# Patient Record
Sex: Female | Born: 1945 | Race: White | Hispanic: No | Marital: Married | Smoking: Former smoker
Health system: Southern US, Community
[De-identification: ages and names within clinical notes are randomized; demographics above are authoritative.]

## PROBLEM LIST (undated history)

## (undated) DIAGNOSIS — M1990 Unspecified osteoarthritis, unspecified site: Secondary | ICD-10-CM

## (undated) DIAGNOSIS — K219 Gastro-esophageal reflux disease without esophagitis: Secondary | ICD-10-CM

## (undated) DIAGNOSIS — Z87898 Personal history of other specified conditions: Secondary | ICD-10-CM

## (undated) DIAGNOSIS — J189 Pneumonia, unspecified organism: Secondary | ICD-10-CM

## (undated) DIAGNOSIS — E041 Nontoxic single thyroid nodule: Secondary | ICD-10-CM

## (undated) DIAGNOSIS — C801 Malignant (primary) neoplasm, unspecified: Secondary | ICD-10-CM

## (undated) DIAGNOSIS — N390 Urinary tract infection, site not specified: Secondary | ICD-10-CM

## (undated) HISTORY — PX: ABDOMINAL HYSTERECTOMY: SHX81

---

## 2000-03-18 HISTORY — PX: NASAL SINUS SURGERY: SHX719

## 2000-03-18 HISTORY — PX: COLON RESECTION: SHX5231

## 2000-04-11 ENCOUNTER — Other Ambulatory Visit: Admission: RE | Admit: 2000-04-11 | Discharge: 2000-04-11 | Payer: Self-pay | Admitting: Otolaryngology

## 2000-10-16 ENCOUNTER — Other Ambulatory Visit: Admission: RE | Admit: 2000-10-16 | Discharge: 2000-10-16 | Payer: Self-pay | Admitting: Gynecology

## 2000-10-20 ENCOUNTER — Ambulatory Visit (HOSPITAL_COMMUNITY): Admission: RE | Admit: 2000-10-20 | Discharge: 2000-10-20 | Payer: Self-pay | Admitting: Gastroenterology

## 2000-11-03 ENCOUNTER — Other Ambulatory Visit: Admission: RE | Admit: 2000-11-03 | Discharge: 2000-11-03 | Payer: Self-pay | Admitting: Urology

## 2001-01-01 ENCOUNTER — Encounter: Admission: RE | Admit: 2001-01-01 | Discharge: 2001-01-13 | Payer: Self-pay | Admitting: Surgery

## 2001-01-01 ENCOUNTER — Encounter: Payer: Self-pay | Admitting: Surgery

## 2001-01-08 ENCOUNTER — Inpatient Hospital Stay (HOSPITAL_COMMUNITY): Admission: RE | Admit: 2001-01-08 | Discharge: 2001-01-13 | Payer: Self-pay | Admitting: Surgery

## 2001-12-10 ENCOUNTER — Other Ambulatory Visit: Admission: RE | Admit: 2001-12-10 | Discharge: 2001-12-10 | Payer: Self-pay | Admitting: Gynecology

## 2002-12-13 ENCOUNTER — Other Ambulatory Visit: Admission: RE | Admit: 2002-12-13 | Discharge: 2002-12-13 | Payer: Self-pay | Admitting: Gynecology

## 2003-12-15 ENCOUNTER — Other Ambulatory Visit: Admission: RE | Admit: 2003-12-15 | Discharge: 2003-12-15 | Payer: Self-pay | Admitting: Gynecology

## 2004-12-31 ENCOUNTER — Other Ambulatory Visit: Admission: RE | Admit: 2004-12-31 | Discharge: 2004-12-31 | Payer: Self-pay | Admitting: Gynecology

## 2006-03-04 ENCOUNTER — Other Ambulatory Visit: Admission: RE | Admit: 2006-03-04 | Discharge: 2006-03-04 | Payer: Self-pay | Admitting: Gynecology

## 2010-10-01 ENCOUNTER — Other Ambulatory Visit: Payer: Self-pay | Admitting: Gynecology

## 2010-11-02 ENCOUNTER — Other Ambulatory Visit: Payer: Self-pay | Admitting: Gastroenterology

## 2010-11-06 ENCOUNTER — Ambulatory Visit
Admission: RE | Admit: 2010-11-06 | Discharge: 2010-11-06 | Disposition: A | Discharge status: Home / Self Care | Payer: Medicare Other | Source: Ambulatory Visit | Attending: Gastroenterology | Admitting: Gastroenterology

## 2010-11-06 MED ORDER — IOHEXOL 300 MG/ML  SOLN
100.0000 mL | Freq: Once | INTRAMUSCULAR | Status: AC | PRN
  Administered 2010-11-06: 100 mL via INTRAVENOUS

## 2011-03-19 DIAGNOSIS — J189 Pneumonia, unspecified organism: Secondary | ICD-10-CM

## 2011-03-19 HISTORY — DX: Pneumonia, unspecified organism: J18.9

## 2011-12-10 ENCOUNTER — Other Ambulatory Visit: Payer: Self-pay | Admitting: Gynecology

## 2012-01-01 ENCOUNTER — Other Ambulatory Visit: Payer: Self-pay | Admitting: Internal Medicine

## 2012-01-01 DIAGNOSIS — E041 Nontoxic single thyroid nodule: Secondary | ICD-10-CM

## 2012-06-25 ENCOUNTER — Other Ambulatory Visit: Payer: Self-pay | Admitting: Orthopaedic Surgery

## 2012-06-25 DIAGNOSIS — M542 Cervicalgia: Secondary | ICD-10-CM

## 2012-06-26 ENCOUNTER — Ambulatory Visit
Admission: RE | Admit: 2012-06-26 | Discharge: 2012-06-26 | Disposition: A | Discharge status: Home / Self Care | Payer: Medicare Other | Source: Ambulatory Visit | Attending: Orthopaedic Surgery | Admitting: Orthopaedic Surgery

## 2012-06-26 DIAGNOSIS — M542 Cervicalgia: Secondary | ICD-10-CM

## 2013-07-05 ENCOUNTER — Other Ambulatory Visit: Payer: Self-pay | Admitting: Internal Medicine

## 2013-07-05 DIAGNOSIS — E041 Nontoxic single thyroid nodule: Secondary | ICD-10-CM

## 2013-07-08 ENCOUNTER — Encounter (INDEPENDENT_AMBULATORY_CARE_PROVIDER_SITE_OTHER): Payer: Self-pay

## 2013-07-08 ENCOUNTER — Ambulatory Visit
Admission: RE | Admit: 2013-07-08 | Discharge: 2013-07-08 | Disposition: A | Discharge status: Home / Self Care | Payer: Medicare Other | Source: Ambulatory Visit | Attending: Internal Medicine | Admitting: Internal Medicine

## 2013-07-08 DIAGNOSIS — E041 Nontoxic single thyroid nodule: Secondary | ICD-10-CM

## 2014-01-24 ENCOUNTER — Other Ambulatory Visit: Payer: Self-pay | Admitting: Internal Medicine

## 2014-01-24 DIAGNOSIS — R140 Abdominal distension (gaseous): Secondary | ICD-10-CM

## 2014-02-01 ENCOUNTER — Ambulatory Visit
Admission: RE | Admit: 2014-02-01 | Discharge: 2014-02-01 | Disposition: A | Discharge status: Home / Self Care | Payer: Medicare Other | Source: Ambulatory Visit | Attending: Internal Medicine | Admitting: Internal Medicine

## 2014-02-01 ENCOUNTER — Encounter (INDEPENDENT_AMBULATORY_CARE_PROVIDER_SITE_OTHER): Payer: Self-pay

## 2014-02-01 ENCOUNTER — Other Ambulatory Visit: Payer: Self-pay | Admitting: Internal Medicine

## 2014-02-01 DIAGNOSIS — R140 Abdominal distension (gaseous): Secondary | ICD-10-CM

## 2014-03-07 ENCOUNTER — Ambulatory Visit (INDEPENDENT_AMBULATORY_CARE_PROVIDER_SITE_OTHER): Payer: Self-pay | Admitting: Surgery

## 2014-05-02 NOTE — Patient Instructions (Addendum)
Sabrina Levy  05/02/2014   Your procedure is scheduled on: 05/06/2014    Report to [facility] Main  Entrance and follow signs to               [place] at      [place].  Call this number if you have problems the morning of surgery [phone]   Remember:  Do not eat food or drink liquids :After Midnight.     Take these medicines the morning of surgery with A SIP OF WATER: none                                You may not have any metal on your body including hair pins and              piercings  Do not wear jewelry, make-up, lotions, powders or perfumes., deodorant.               Do not wear nail polish.  Do not shave  48 hours prior to surgery.     Do not bring valuables to the hospital. [place].  Contacts, dentures or bridgework may not be worn into surgery.      Patients discharged the day of surgery will not be allowed to drive home.  Name and phone number of your driver:  Special Instructions: coughing and deep breathing exercises, leg exercises              Please read over the following fact sheets you were given: _____________________________________________________________________             [facility] - Preparing for Surgery Before surgery, you can play an important role.  Because skin is not sterile, your skin needs to be as free of germs as possible.  You can reduce the number of germs on your skin by washing with CHG (chlorahexidine gluconate) soap before surgery.  CHG is an antiseptic cleaner which kills germs and bonds with the skin to continue killing germs even after washing. Please DO NOT use if you have an allergy to CHG or antibacterial soaps.  If your skin becomes reddened/irritated stop using the CHG and inform your nurse when you arrive at Short Stay. Do not shave (including legs and underarms) for at least 48 hours prior to the first CHG shower.  You may shave your  face/neck. Please follow these instructions carefully:  1.  Shower with CHG Soap the night before surgery and the  morning of Surgery.  2.  If you choose to wash your hair, wash your hair first as usual with your  normal  shampoo.  3.  After you shampoo, rinse your hair and body thoroughly to remove the  shampoo.                           4.  Use CHG as you would any other liquid soap.  You can apply chg directly  to the skin and wash                       Gently with a scrungie or clean washcloth.  5.  Apply the CHG Soap to your  body ONLY FROM THE NECK DOWN.   Do not use on face/ open                           Wound or open sores. Avoid contact with eyes, ears mouth and genitals (private parts).                       Wash face,  Genitals (private parts) with your normal soap.             6.  Wash thoroughly, paying special attention to the area where your surgery  will be performed.  7.  Thoroughly rinse your body with warm water from the neck down.  8.  DO NOT shower/wash with your normal soap after using and rinsing off  the CHG Soap.                9.  Pat yourself dry with a clean towel.            10.  Wear clean pajamas.            11.  Place clean sheets on your bed the night of your first shower and do not  sleep with pets. Day of Surgery : Do not apply any lotions/deodorants the morning of surgery.  Please wear clean clothes to the hospital/surgery center.  FAILURE TO FOLLOW THESE INSTRUCTIONS MAY RESULT IN THE CANCELLATION OF YOUR SURGERY PATIENT SIGNATURE_________________________________  NURSE SIGNATURE__________________________________  ________________________________________________________________________

## 2014-05-03 ENCOUNTER — Encounter (HOSPITAL_COMMUNITY)
Admission: RE | Admit: 2014-05-03 | Discharge: 2014-05-03 | Disposition: A | Discharge status: Home / Self Care | Payer: Medicare Other | Source: Ambulatory Visit | Attending: Surgery | Admitting: Surgery

## 2014-05-03 ENCOUNTER — Encounter (HOSPITAL_COMMUNITY): Payer: Self-pay

## 2014-05-03 ENCOUNTER — Inpatient Hospital Stay (HOSPITAL_COMMUNITY): Admission: RE | Admit: 2014-05-03 | Payer: Medicare Other | Source: Ambulatory Visit

## 2014-05-03 DIAGNOSIS — R109 Unspecified abdominal pain: Secondary | ICD-10-CM | POA: Diagnosis present

## 2014-05-03 DIAGNOSIS — Z9049 Acquired absence of other specified parts of digestive tract: Secondary | ICD-10-CM | POA: Diagnosis not present

## 2014-05-03 DIAGNOSIS — Z90710 Acquired absence of both cervix and uterus: Secondary | ICD-10-CM | POA: Diagnosis not present

## 2014-05-03 DIAGNOSIS — Z886 Allergy status to analgesic agent status: Secondary | ICD-10-CM | POA: Diagnosis not present

## 2014-05-03 DIAGNOSIS — Z87891 Personal history of nicotine dependence: Secondary | ICD-10-CM | POA: Diagnosis not present

## 2014-05-03 DIAGNOSIS — K811 Chronic cholecystitis: Secondary | ICD-10-CM | POA: Diagnosis not present

## 2014-05-03 HISTORY — DX: Urinary tract infection, site not specified: N39.0

## 2014-05-03 HISTORY — DX: Nontoxic single thyroid nodule: E04.1

## 2014-05-03 HISTORY — DX: Malignant (primary) neoplasm, unspecified: C80.1

## 2014-05-03 HISTORY — DX: Unspecified osteoarthritis, unspecified site: M19.90

## 2014-05-03 HISTORY — DX: Gastro-esophageal reflux disease without esophagitis: K21.9

## 2014-05-03 LAB — CBC
HEMATOCRIT: 40.4 % (ref 36.0–46.0)
Hemoglobin: 13 g/dL (ref 12.0–15.0)
MCH: 29.7 pg (ref 26.0–34.0)
MCHC: 32.2 g/dL (ref 30.0–36.0)
MCV: 92.4 fL (ref 78.0–100.0)
Platelets: 417 10*3/uL — ABNORMAL HIGH (ref 150–400)
RBC: 4.37 MIL/uL (ref 3.87–5.11)
RDW: 14.5 % (ref 11.5–15.5)
WBC: 10.5 10*3/uL (ref 4.0–10.5)

## 2014-05-04 NOTE — Progress Notes (Signed)
Quick Note:  These results are acceptable for scheduled surgery.  Frazier Balfour M. Rudell Ortman, MD, FACS [facility] Office: [phone]   ______ 

## 2014-05-06 ENCOUNTER — Observation Stay (HOSPITAL_COMMUNITY)
Admission: RE | Admit: 2014-05-06 | Discharge: 2014-05-07 | Disposition: A | Discharge status: Home / Self Care | Payer: Medicare Other | Source: Ambulatory Visit | Attending: Surgery | Admitting: Surgery

## 2014-05-06 ENCOUNTER — Ambulatory Visit (HOSPITAL_COMMUNITY): Payer: Medicare Other

## 2014-05-06 ENCOUNTER — Ambulatory Visit (HOSPITAL_COMMUNITY): Payer: Medicare Other | Admitting: Anesthesiology

## 2014-05-06 ENCOUNTER — Encounter (HOSPITAL_COMMUNITY)
Admission: RE | Disposition: A | Discharge status: Home / Self Care | Payer: Self-pay | Source: Ambulatory Visit | Attending: Surgery

## 2014-05-06 ENCOUNTER — Encounter (HOSPITAL_COMMUNITY): Payer: Self-pay

## 2014-05-06 DIAGNOSIS — K811 Chronic cholecystitis: Secondary | ICD-10-CM | POA: Diagnosis not present

## 2014-05-06 DIAGNOSIS — Z90710 Acquired absence of both cervix and uterus: Secondary | ICD-10-CM | POA: Insufficient documentation

## 2014-05-06 DIAGNOSIS — Z886 Allergy status to analgesic agent status: Secondary | ICD-10-CM | POA: Diagnosis not present

## 2014-05-06 DIAGNOSIS — Z87891 Personal history of nicotine dependence: Secondary | ICD-10-CM | POA: Insufficient documentation

## 2014-05-06 DIAGNOSIS — K828 Other specified diseases of gallbladder: Secondary | ICD-10-CM

## 2014-05-06 DIAGNOSIS — Z9049 Acquired absence of other specified parts of digestive tract: Secondary | ICD-10-CM | POA: Diagnosis not present

## 2014-05-06 DIAGNOSIS — Z419 Encounter for procedure for purposes other than remedying health state, unspecified: Secondary | ICD-10-CM

## 2014-05-06 HISTORY — DX: Personal history of other specified conditions: Z87.898

## 2014-05-06 HISTORY — PX: CHOLECYSTECTOMY: SHX55

## 2014-05-06 HISTORY — DX: Pneumonia, unspecified organism: J18.9

## 2014-05-06 SURGERY — LAPAROSCOPIC CHOLECYSTECTOMY WITH INTRAOPERATIVE CHOLANGIOGRAM
Anesthesia: General | Site: Abdomen

## 2014-05-06 MED ORDER — FENTANYL CITRATE 0.05 MG/ML IJ SOLN
INTRAMUSCULAR | Status: AC
  Filled 2014-05-06: qty 2

## 2014-05-06 MED ORDER — ROCURONIUM BROMIDE 100 MG/10ML IV SOLN
INTRAVENOUS | Status: AC
  Filled 2014-05-06: qty 1

## 2014-05-06 MED ORDER — IOHEXOL 300 MG/ML  SOLN
INTRAMUSCULAR | Status: DC | PRN
  Administered 2014-05-06: 13 mL

## 2014-05-06 MED ORDER — FENTANYL CITRATE 0.05 MG/ML IJ SOLN
25.0000 ug | INTRAMUSCULAR | Status: DC | PRN
Start: 2014-05-06 — End: 2014-05-06
  Administered 2014-05-06 (×3): 50 ug via INTRAVENOUS

## 2014-05-06 MED ORDER — LIDOCAINE HCL (CARDIAC) 20 MG/ML IV SOLN
INTRAVENOUS | Status: AC
  Filled 2014-05-06: qty 5

## 2014-05-06 MED ORDER — LIDOCAINE HCL (CARDIAC) 20 MG/ML IV SOLN
INTRAVENOUS | Status: DC | PRN
  Administered 2014-05-06: 60 mg via INTRAVENOUS

## 2014-05-06 MED ORDER — METOCLOPRAMIDE HCL 5 MG/ML IJ SOLN
INTRAMUSCULAR | Status: DC | PRN
Start: 2014-05-06 — End: 2014-05-06
  Administered 2014-05-06: 10 mg via INTRAVENOUS

## 2014-05-06 MED ORDER — ONDANSETRON HCL 4 MG/2ML IJ SOLN
INTRAMUSCULAR | Status: AC
  Filled 2014-05-06: qty 2

## 2014-05-06 MED ORDER — FENTANYL CITRATE 0.05 MG/ML IJ SOLN
INTRAMUSCULAR | Status: DC | PRN
  Administered 2014-05-06 (×5): 50 ug via INTRAVENOUS

## 2014-05-06 MED ORDER — GLYCOPYRROLATE 0.2 MG/ML IJ SOLN
INTRAMUSCULAR | Status: AC
  Filled 2014-05-06: qty 1

## 2014-05-06 MED ORDER — GLYCOPYRROLATE 0.2 MG/ML IJ SOLN
INTRAMUSCULAR | Status: DC | PRN
  Administered 2014-05-06: 0.4 mg via INTRAVENOUS

## 2014-05-06 MED ORDER — PROPOFOL 10 MG/ML IV BOLUS
INTRAVENOUS | Status: DC | PRN
  Administered 2014-05-06: 100 mg via INTRAVENOUS

## 2014-05-06 MED ORDER — ACETAMINOPHEN 325 MG PO TABS: 650.0000 mg | ORAL_TABLET | ORAL | Status: DC | PRN

## 2014-05-06 MED ORDER — NEOSTIGMINE METHYLSULFATE 10 MG/10ML IV SOLN
INTRAVENOUS | Status: AC
  Filled 2014-05-06: qty 1

## 2014-05-06 MED ORDER — ONDANSETRON HCL 4 MG/2ML IJ SOLN
INTRAMUSCULAR | Status: DC | PRN
  Administered 2014-05-06: 4 mg via INTRAVENOUS

## 2014-05-06 MED ORDER — NEOSTIGMINE METHYLSULFATE 10 MG/10ML IV SOLN
INTRAVENOUS | Status: DC | PRN
  Administered 2014-05-06: 3 mg via INTRAVENOUS

## 2014-05-06 MED ORDER — KCL IN DEXTROSE-NACL 20-5-0.45 MEQ/L-%-% IV SOLN
INTRAVENOUS | Status: DC
  Administered 2014-05-06: 11:00:00 via INTRAVENOUS
  Filled 2014-05-06 (×2): qty 1000

## 2014-05-06 MED ORDER — ONDANSETRON HCL 4 MG/2ML IJ SOLN: 4.0000 mg | Freq: Four times a day (QID) | INTRAMUSCULAR | Status: DC | PRN

## 2014-05-06 MED ORDER — DEXAMETHASONE SODIUM PHOSPHATE 10 MG/ML IJ SOLN
INTRAMUSCULAR | Status: DC | PRN
  Administered 2014-05-06: 10 mg via INTRAVENOUS

## 2014-05-06 MED ORDER — CEFAZOLIN SODIUM-DEXTROSE 2-3 GM-% IV SOLR
INTRAVENOUS | Status: AC
  Filled 2014-05-06: qty 50

## 2014-05-06 MED ORDER — ONDANSETRON HCL 4 MG PO TABS: 4.0000 mg | ORAL_TABLET | Freq: Four times a day (QID) | ORAL | Status: DC | PRN

## 2014-05-06 MED ORDER — BUPIVACAINE-EPINEPHRINE 0.5% -1:200000 IJ SOLN
INTRAMUSCULAR | Status: DC | PRN
  Administered 2014-05-06: 20 mL

## 2014-05-06 MED ORDER — HYDROCODONE-ACETAMINOPHEN 5-325 MG PO TABS
1.0000 | ORAL_TABLET | ORAL | Status: DC | PRN
  Administered 2014-05-06 (×3): 1 via ORAL
  Administered 2014-05-07: 2 via ORAL
  Administered 2014-05-07: 1 via ORAL
  Filled 2014-05-06 (×4): qty 1
  Filled 2014-05-06: qty 2

## 2014-05-06 MED ORDER — ROCURONIUM BROMIDE 100 MG/10ML IV SOLN
INTRAVENOUS | Status: DC | PRN
  Administered 2014-05-06: 40 mg via INTRAVENOUS

## 2014-05-06 MED ORDER — LACTATED RINGERS IV SOLN
INTRAVENOUS | Status: DC
  Administered 2014-05-06: 09:00:00 via INTRAVENOUS

## 2014-05-06 MED ORDER — LACTATED RINGERS IV SOLN
INTRAVENOUS | Status: DC | PRN
  Administered 2014-05-06: 07:00:00 via INTRAVENOUS

## 2014-05-06 MED ORDER — LACTATED RINGERS IR SOLN
Status: DC | PRN
  Administered 2014-05-06: 1000 mL

## 2014-05-06 MED ORDER — MIDAZOLAM HCL 2 MG/2ML IJ SOLN
INTRAMUSCULAR | Status: AC
  Filled 2014-05-06: qty 2

## 2014-05-06 MED ORDER — ONDANSETRON HCL 4 MG/2ML IJ SOLN
4.0000 mg | Freq: Once | INTRAMUSCULAR | Status: AC | PRN
  Administered 2014-05-06: 4 mg via INTRAVENOUS

## 2014-05-06 MED ORDER — PROPOFOL 10 MG/ML IV BOLUS
INTRAVENOUS | Status: AC
  Filled 2014-05-06: qty 20

## 2014-05-06 MED ORDER — MIDAZOLAM HCL 5 MG/5ML IJ SOLN
INTRAMUSCULAR | Status: DC | PRN
  Administered 2014-05-06: 1 mg via INTRAVENOUS

## 2014-05-06 MED ORDER — HYDROMORPHONE HCL 1 MG/ML IJ SOLN: 1.0000 mg | INTRAMUSCULAR | Status: DC | PRN

## 2014-05-06 MED ORDER — CEFAZOLIN SODIUM-DEXTROSE 2-3 GM-% IV SOLR
2.0000 g | INTRAVENOUS | Status: AC
  Administered 2014-05-06: 2 g via INTRAVENOUS

## 2014-05-06 MED ORDER — BUPIVACAINE-EPINEPHRINE (PF) 0.5% -1:200000 IJ SOLN
INTRAMUSCULAR | Status: AC
  Filled 2014-05-06: qty 30

## 2014-05-06 MED ORDER — FENTANYL CITRATE 0.05 MG/ML IJ SOLN
INTRAMUSCULAR | Status: AC
  Filled 2014-05-06: qty 5

## 2014-05-06 MED ORDER — MEPERIDINE HCL 50 MG/ML IJ SOLN: 6.2500 mg | INTRAMUSCULAR | Status: DC | PRN

## 2014-05-06 SURGICAL SUPPLY — 33 items
APPLIER CLIP ROT 10 11.4 M/L (STAPLE) ×2
BENZOIN TINCTURE PRP APPL 2/3 (GAUZE/BANDAGES/DRESSINGS) ×2 IMPLANT
CABLE HIGH FREQUENCY MONO STRZ (ELECTRODE) ×2 IMPLANT
CHLORAPREP W/TINT 26ML (MISCELLANEOUS) ×2 IMPLANT
CLIP APPLIE ROT 10 11.4 M/L (STAPLE) ×1 IMPLANT
COVER MAYO STAND STRL (DRAPES) ×2 IMPLANT
DECANTER SPIKE VIAL GLASS SM (MISCELLANEOUS) ×2 IMPLANT
DRAPE C-ARM 42X120 X-RAY (DRAPES) ×2 IMPLANT
DRAPE LAPAROSCOPIC ABDOMINAL (DRAPES) ×2 IMPLANT
DRAPE UTILITY XL STRL (DRAPES) ×2 IMPLANT
ELECT REM PT RETURN 9FT ADLT (ELECTROSURGICAL) ×2
ELECTRODE REM PT RTRN 9FT ADLT (ELECTROSURGICAL) ×1 IMPLANT
GAUZE SPONGE 2X2 8PLY STRL LF (GAUZE/BANDAGES/DRESSINGS) ×1 IMPLANT
GLOVE SURG ORTHO 8.0 STRL STRW (GLOVE) ×2 IMPLANT
GOWN STRL REUS W/TWL XL LVL3 (GOWN DISPOSABLE) ×4 IMPLANT
HEMOSTAT SURGICEL 4X8 (HEMOSTASIS) IMPLANT
KIT BASIN OR (CUSTOM PROCEDURE TRAY) ×2 IMPLANT
LIQUID BAND (GAUZE/BANDAGES/DRESSINGS) ×2 IMPLANT
NS IRRIG 1000ML POUR BTL (IV SOLUTION) ×2 IMPLANT
POUCH SPECIMEN RETRIEVAL 10MM (ENDOMECHANICALS) ×2 IMPLANT
SCISSORS LAP 5X35 DISP (ENDOMECHANICALS) ×2 IMPLANT
SET CHOLANGIOGRAPH MIX (MISCELLANEOUS) ×2 IMPLANT
SET IRRIG TUBING LAPAROSCOPIC (IRRIGATION / IRRIGATOR) ×2 IMPLANT
SLEEVE XCEL OPT CAN 5 100 (ENDOMECHANICALS) ×2 IMPLANT
SPONGE GAUZE 2X2 STER 10/PKG (GAUZE/BANDAGES/DRESSINGS) ×1
STRIP CLOSURE SKIN 1/2X4 (GAUZE/BANDAGES/DRESSINGS) ×2 IMPLANT
SUT MNCRL AB 4-0 PS2 18 (SUTURE) ×2 IMPLANT
TOWEL OR 17X26 10 PK STRL BLUE (TOWEL DISPOSABLE) ×2 IMPLANT
TOWEL OR NON WOVEN STRL DISP B (DISPOSABLE) ×2 IMPLANT
TRAY LAPAROSCOPIC (CUSTOM PROCEDURE TRAY) ×2 IMPLANT
TROCAR BLADELESS OPT 5 100 (ENDOMECHANICALS) ×2 IMPLANT
TROCAR XCEL BLUNT TIP 100MML (ENDOMECHANICALS) ×2 IMPLANT
TROCAR XCEL NON-BLD 11X100MML (ENDOMECHANICALS) ×2 IMPLANT

## 2014-05-06 NOTE — H&P (Signed)
  General Surgery [facility]  [address] DOB: 06/28/1945 Married / Language: English / Race: White Female  History of Present Illness Patient words: gallbladder.  The patient is a 69 year old female who presents for evaluation of gallbladder disease. Patient is referred by Dr. Shon Baton for consideration for cholecystectomy for biliary dyskinesia. Patient has had long-standing back pain and upper abdominal bloating following meals. This has gone on for a number of years. She denies any nausea or vomiting. She denies fevers or chills. She has had no jaundice or acholic stools. Ultrasound in November 2015 shows no evidence of gallstones or biliary obstruction. Patient has had previous hepatobiliary scans which show a low gallbladder ejection fraction. Patient also reports having symptoms identical to her current complaints with administration of cholecystokinin during the hepatobiliary scan. There is no significant family history of biliary disease. Patient did have colonoscopy 3 years ago with polypectomy. She has had a previous EGD but that has been several years ago. Other than sigmoid colectomy, the patient has had an abdominal hysterectomy. She had appendectomy at that time as well.   Allergies Aspirin EC Low Dose *ANALGESICS - NonNarcotic*  Medication History ValACYclovir HCl (1GM Tablet, Oral) Active. Atorvastatin Calcium (20MG  Tablet, Oral) Active.  Vitals  03/07/2014 9:52 AM Weight: 147 lb Height: 61in Body Surface Area: 1.69 m Body Mass Index: 27.78 kg/m Temp.: 46F(Temporal)  Pulse: 79 (Regular)  BP: 130/74 (Sitting, Left Arm, Standard)    Physical Exam   General - appears comfortable, no distress; not diaphorectic  HEENT - normocephalic; sclerae clear, gaze conjugate; mucous membranes moist, dentition good; voice normal  Neck - symmetric on extension; no palpable anterior or posterior cervical adenopathy; no palpable  masses in the thyroid bed  Chest - clear bilaterally with rhonchi, rales, or wheeze  Cor - regular rhythm with normal rate; no significant murmur  Abd - soft without distension; well-healed lower midline incision; no sign of hernia; no hepatosplenomegaly; no masses; no significant tenderness  Ext - non-tender without significant edema or lymphedema  Neuro - grossly intact; no tremor    Assessment & Plan   BILIARY DYSKINESIA (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 She has intermittent pain associated with meals and bloating. Her symptoms were reproduced by cholecystokinin administration with her hepatobiliary scan. Ultrasound shows no evidence of gallstones.  I provided the patient with written literature to review at home. We reviewed the literature and discussed the anatomy and physiology of biliary dyskinesia. I have recommended proceeding with laparoscopic cholecystectomy with intraoperative cholangiography. I explained to the patient that there is approximately a 60-65% chance of symptom relief with this procedure. If she has persistent symptoms, she may require EGD or further diagnostic testing. She understands and wishes to proceed in the near future. We will make arrangements for surgery at a time convenient for the patient.  The risks and benefits of the procedure have been discussed at length with the patient. The patient understands the proposed procedure, potential alternative treatments, and the course of recovery to be expected. All of the patient's questions have been answered at this time. The patient wishes to proceed with surgery.   Earnstine Regal, MD, [facility] Office: [phone]

## 2014-05-06 NOTE — Transfer of Care (Signed)
Immediate Anesthesia Transfer of Care Note  Patient: Sabrina Levy  Procedure(s) Performed: Procedure(s): LAPAROSCOPIC CHOLECYSTECTOMY WITH INTRAOPERATIVE CHOLANGIOGRAM (N/A)  Patient Location: PACU  Anesthesia Type:General  Level of Consciousness: awake, alert , oriented and patient cooperative  Airway & Oxygen Therapy: Patient Spontanous Breathing and Patient connected to nasal cannula oxygen  Post-op Assessment: Report given to RN and Post -op Vital signs reviewed and stable  Post vital signs: Reviewed and stable  Last Vitals:  Filed Vitals:   05/06/14 0850  BP: 144/72  Pulse: 80  Temp:   Resp: 17    Complications: No apparent anesthesia complications

## 2014-05-06 NOTE — Progress Notes (Signed)
Just over a sinus infection . Completed cough syrup and antibiotics of 10 day dose. Afebrile since

## 2014-05-06 NOTE — Anesthesia Procedure Notes (Signed)
Procedure Name: Intubation Date/Time: 05/06/2014 7:42 AM Performed by: Sherian Maroon A Pre-anesthesia Checklist: Patient identified, Emergency Drugs available, Suction available, Patient being monitored and Timeout performed Patient Re-evaluated:Patient Re-evaluated prior to inductionOxygen Delivery Method: Circle system utilized Preoxygenation: Pre-oxygenation with 100% oxygen Intubation Type: IV induction Ventilation: Oral airway inserted - appropriate to patient size Laryngoscope Size: Mac and 3 Grade View: Grade III Tube size: 7.5 mm Airway Equipment and Method: Bougie stylet Placement Confirmation: ETT inserted through vocal cords under direct vision,  positive ETCO2,  CO2 detector and breath sounds checked- equal and bilateral Secured at: 22 cm Tube secured with: Tape Dental Injury: Teeth and Oropharynx as per pre-operative assessment

## 2014-05-06 NOTE — Anesthesia Postprocedure Evaluation (Signed)
  Anesthesia Post-op Note  Patient: Sabrina Levy  Procedure(s) Performed: Procedure(s): LAPAROSCOPIC CHOLECYSTECTOMY WITH INTRAOPERATIVE CHOLANGIOGRAM (N/A) Patient is awake and responsive. Pain and nausea are reasonably well controlled. Vital signs are stable and clinically acceptable. Oxygen saturation is clinically acceptable. There are no apparent anesthetic complications at this time. Patient is ready for discharge.

## 2014-05-06 NOTE — Op Note (Signed)
Procedure Note  Pre-operative Diagnosis:  Biliary dyskinesia, abdominal pain  Post-operative Diagnosis:  same  Surgeon:  Earnstine Regal, MD, FACS  Assistant:  none   Procedure:  Laparoscopic cholecystectomy with intra-operative cholangiography  Anesthesia:  General  Estimated Blood Loss:  minimal  Drains: none         Specimen: Gallbladder to pathology  Indications:  69 yo WF with long history of abdominal pain following meals, abnormal HIDA scan, pain with CCK administration.  Now for cholecystectomy.  Procedure Details:  The patient was seen in the pre-op holding area. The risks, benefits, complications, treatment options, and expected outcomes were previously discussed with the patient. The patient agreed with the proposed plan and has signed the informed consent form.  The patient was brought to the Operating Room, identified as ROSEMARY MOSSBARGER and the procedure verified as laparoscopic cholecystectomy with intraoperative cholangiography. A "time out" was completed and the above information confirmed.  The patient was placed in the supine position. Following induction of general anesthesia, the abdomen was prepped and draped in the usual aseptic fashion.  An incision was made in the skin near the umbilicus. The midline fascia was incised and the peritoneal cavity was entered and a Hasson canula was introduced under direct vision.  The Hasson canula was secured with a 0-Vicryl pursestring suture. Pneumoperitoneum was established with carbon dioxide. Additional trocars were introduced under direct vision along the right costal margin in the midline, mid-clavicular line, and anterior axillary line.   The gallbladder was identified and the fundus grasped and retracted cephalad. Significant omental adhesions were taken down bluntly and the electrocautery was utilized as needed, taking care not to injure any adjacent structures. The infundibulum was grasped and retracted laterally,  exposing the peritoneum overlying the triangle of Calot. The peritoneum was incised and structures exposed with blunt dissection. The cystic duct was clearly identified, bluntly dissected circumferentially, and clipped at the neck of the gallbladder.  An incision was made in the cystic duct and the cholangiogram catheter introduced. The catheter was secured using an ligaclip.  Real-time cholangiography was performed using C-arm fluoroscopy.  There was rapid filling of a normal caliber common bile duct.  There was reflux of contrast into the left and right hepatic ductal systems.  There was free flow distally into the duodenum without filling defect or obstruction.  The catheter was removed from the peritoneal cavity.  The cystic duct was then ligated with surgical clips and divided. The cystic artery was identified, dissected circumferentially, ligated with ligaclips, and divided.  The gallbladder was dissected away from the liver bed using the electrocautery for hemostasis. The gallbladder was completely removed from the liver and placed into an endocatch bag. The gallbladder was removed in the endocatch bag through the umbilical port site and submitted to pathology for review.  The right upper quadrant was irrigated and the gallbladder bed was inspected. Hemostasis was achieved with the electrocautery.  Pneumoperitoneum was released after viewing removal of the trocars with good hemostasis noted. The umbilical wound was irrigated and the fascia was then closed with the pursestring suture.  Local anesthetic was infiltrated at all port sites. The skin incisions were closed with 4-0 Monocril subcuticular sutures and Dermabond was applied.  Instrument, sponge, and needle counts were correct at the conclusion of the case.  The patient was awakened from anesthesia and brought to the recovery room in stable condition.  The patient tolerated the procedure well.   Earnstine Regal, MD, [facility] Office: [phone]

## 2014-05-06 NOTE — Anesthesia Preprocedure Evaluation (Addendum)
Anesthesia Evaluation  Patient identified by MRN, date of birth, ID band Patient awake    Reviewed: Allergy & Precautions, H&P , Patient's Chart, lab work & pertinent test results, reviewed documented beta blocker date and time   Airway Mallampati: II  TM Distance: >3 FB Neck ROM: full    Dental no notable dental hx.    Pulmonary former smoker,  breath sounds clear to auscultation  Pulmonary exam normal       Cardiovascular Rhythm:regular Rate:Normal     Neuro/Psych    GI/Hepatic   Endo/Other    Renal/GU      Musculoskeletal   Abdominal   Peds  Hematology   Anesthesia Other Findings No CAD Sx  Reproductive/Obstetrics                           Anesthesia Physical Anesthesia Plan  ASA: II  Anesthesia Plan: General   Post-op Pain Management:    Induction: Intravenous  Airway Management Planned: Oral ETT  Additional Equipment:   Intra-op Plan:   Post-operative Plan: Extubation in OR  Informed Consent: I have reviewed the patients History and Physical, chart, labs and discussed the procedure including the risks, benefits and alternatives for the proposed anesthesia with the patient or authorized representative who has indicated his/her understanding and acceptance.   Dental Advisory Given and Dental advisory given  Plan Discussed with: CRNA and Surgeon  Anesthesia Plan Comments: (  Discussed general anesthesia, including possible nausea, instrumentation of airway, sore throat,pulmonary aspiration, etc. I asked if the were any outstanding questions, or  concerns before we proceeded. )        Anesthesia Quick Evaluation

## 2014-05-07 DIAGNOSIS — K811 Chronic cholecystitis: Secondary | ICD-10-CM | POA: Diagnosis not present

## 2014-05-07 MED ORDER — HYDROCODONE-ACETAMINOPHEN 5-325 MG PO TABS: 1.0000 | ORAL_TABLET | ORAL | Status: DC | PRN

## 2014-05-07 NOTE — Progress Notes (Signed)
Utilization Review completed.  

## 2014-05-07 NOTE — Progress Notes (Signed)
Pt discharged to home. DC instructions given. No concerns voiced. Prescription x 1 given for pain med. Pt also medicated prior to discharge. Left unit in wheelchair pushed by nurse tech. Left in good condition. Pt's husband came in to take pt to home. Vwilliams,rn.

## 2014-05-07 NOTE — Discharge Summary (Signed)
Physician Discharge Summary [facility]  Patient ID: Sabrina Levy MRN: [id number] DOB/AGE: 1945/10/19 69 y.o.  Admit date: 05/06/2014 Discharge date: 05/07/2014  Admission Diagnoses:  Biliary dyskinesia, abdominal pain  Discharge Diagnoses:  Principal Problem:   Biliary dyskinesia   Discharged Condition: good  Hospital Course: patient admitted for observation after lap chole with IOC.  Post op course uncomplicated.  Diet advanced to regular.  Pain well controlled.  Prepared for discharge on POD#1.  Consults: None  Treatments: surgery: lap chole with IOC  Discharge Exam: Blood pressure 111/58, pulse 64, temperature 98.6 F (37 C), temperature source Oral, resp. rate 20, height 5\' 2"  (1.575 m), weight 145 lb (65.772 kg), SpO2 96 %. HEENT - clear Neck - soft Chest - clear bilaterally Cor - RRR Abd - soft without distension; wounds dry and dressings intact  Disposition: Home  Discharge Instructions    Diet - low sodium heart healthy    Complete by:  As directed      Discharge instructions    Complete by:  As directed   [facility]  LAPAROSCOPIC SURGERY - POST-OP INSTRUCTIONS  Always review your discharge instruction sheet given to you by the facility where your surgery was performed.  A prescription for pain medication may be given to you upon discharge.  Take your pain medication as prescribed.  If narcotic pain medicine is not needed, then you may take acetaminophen (Tylenol) or ibuprofen (Advil) as needed.  Take your usually prescribed medications unless otherwise directed.  If you need a refill on your pain medication, please contact your pharmacy.  They will contact our office to request authorization. Prescriptions will not be filled after 5 P.M. or on weekends.  You should follow a light diet the first few days after arrival home, such as soup and crackers or toast.  Be sure to include plenty of fluids daily.  Most  patients will experience some swelling and bruising in the area of the incisions.  Ice packs will help.  Swelling and bruising can take several days to resolve.   It is common to experience some constipation if taking pain medication after surgery.  Increasing fluid intake and taking a stool softener (such as Colace) will usually help or prevent this problem from occurring.  A mild laxative (Milk of Magnesia or Miralax) should be taken according to package instructions if there are no bowel movements after 48 hours.  Unless discharge instructions indicate otherwise, you may remove your bandages 24-48 hours after surgery, and you may shower at that time.  You may have steri-strips (small skin tapes) in place directly over the incision.  These strips should be left on the skin for 7-10 days.  If your surgeon used skin glue on the incision, you may shower in 24 hours.  The glue will flake off over the next 2-3 weeks.  Any sutures or staples will be removed at the office during your follow-up visit.  ACTIVITIES:  You may resume regular (light) daily activities beginning the next day-such as daily self-care, walking, climbing stairs-gradually increasing activities as tolerated.  You may have sexual intercourse when it is comfortable.  Refrain from any heavy lifting or straining until approved by your doctor.  You may drive when you are no longer taking prescription pain medication, you can comfortably wear a seatbelt, and you can safely maneuver your car and apply brakes.  You should see your doctor in the office for a follow-up appointment approximately 2-3  weeks after your surgery.  Make sure that you call for this appointment within a day or two after you arrive home to insure a convenient appointment time.  WHEN TO CALL YOUR DOCTOR: Fever over 101.0 Inability to urinate Continued bleeding from incision Increased pain, redness, or drainage from the incision Increasing abdominal pain  The clinic  staff is available to answer your questions during regular business hours.  Please don't hesitate to call and ask to speak to one of the nurses for clinical concerns.  If you have a medical emergency, go to the nearest emergency room or call 911.  A surgeon from [facility] is always on call for the hospital.  Earnstine Regal, MD, [facility] Office: [place] Free:  [phone] FAX [phone]  Web site: [email or web address]     Increase activity slowly    Complete by:  As directed      No dressing needed    Complete by:  As directed             Medication List    TAKE these medications        acetaminophen 500 MG tablet  Commonly known as:  TYLENOL  Take 500 mg by mouth every 6 (six) hours as needed for mild pain.     atorvastatin 10 MG tablet  Commonly known as:  LIPITOR  Take 10 mg by mouth daily.     HYDROcodone-acetaminophen 5-325 MG per tablet  Commonly known as:  NORCO/VICODIN  Take 1-2 tablets by mouth every 4 (four) hours as needed for moderate pain.     polyethylene glycol packet  Commonly known as:  MIRALAX / GLYCOLAX  Take 17 g by mouth daily as needed for mild constipation.     valACYclovir 1000 MG tablet  Commonly known as:  VALTREX  Take 2,000 mg by mouth 2 (two) times daily as needed (outbreaks).           Follow-up Information    Follow up with Earnstine Regal, MD. Schedule an appointment as soon as possible for a visit in 3 weeks.   Specialty:  General Surgery   Why:  For wound re-check   Contact information:   [place] [address] [phone]       Earnstine Regal, MD, [facility] Office: [phone]   Signed: Earnstine Regal 05/07/2014, 7:51 AM

## 2014-05-09 ENCOUNTER — Encounter (HOSPITAL_COMMUNITY): Payer: Self-pay | Admitting: Surgery

## 2016-02-15 ENCOUNTER — Other Ambulatory Visit: Payer: Self-pay | Admitting: Obstetrics and Gynecology

## 2016-02-15 DIAGNOSIS — R928 Other abnormal and inconclusive findings on diagnostic imaging of breast: Secondary | ICD-10-CM

## 2016-02-21 ENCOUNTER — Other Ambulatory Visit: Payer: Medicare Other

## 2016-03-01 ENCOUNTER — Ambulatory Visit
Admission: RE | Admit: 2016-03-01 | Discharge: 2016-03-01 | Disposition: A | Discharge status: Home / Self Care | Payer: Medicare Other | Source: Ambulatory Visit | Attending: Obstetrics and Gynecology | Admitting: Obstetrics and Gynecology

## 2016-03-01 DIAGNOSIS — R928 Other abnormal and inconclusive findings on diagnostic imaging of breast: Secondary | ICD-10-CM

## 2016-04-09 IMAGING — RF DG CHOLANGIOGRAM OPERATIVE
1 series · 4 of 4 positions shown · non-contrast
Comparison: Abdominal ultrasound - 02/01/2014; abdominal CT
-10/28/2010

CLINICAL DATA: History of gallstones.  Evaluate CBD for stones.

EXAM:
INTRAOPERATIVE CHOLANGIOGRAM
FLUOROSCOPY TIME:  20 seconds

[Series 1: run · 4 of 127 frames shown]
[frame 4/127]
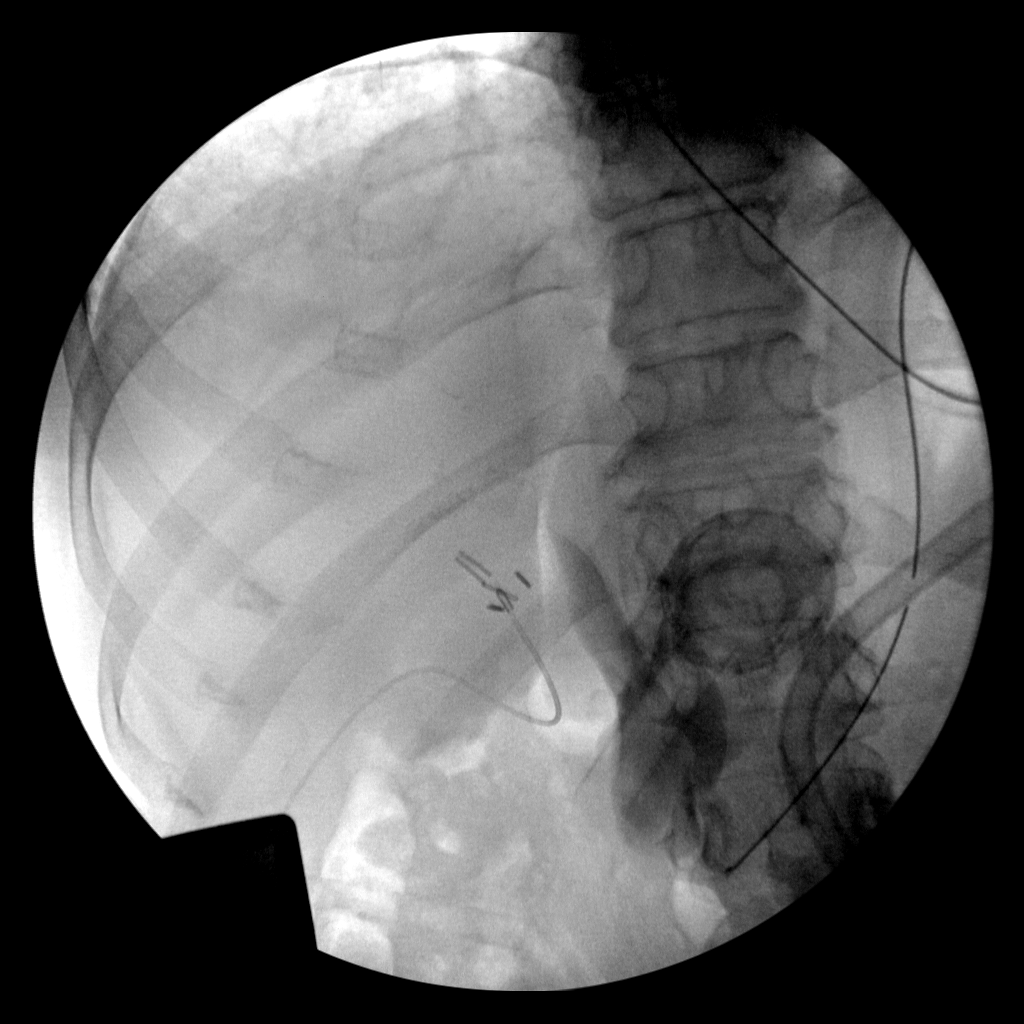
[frame 20/127]
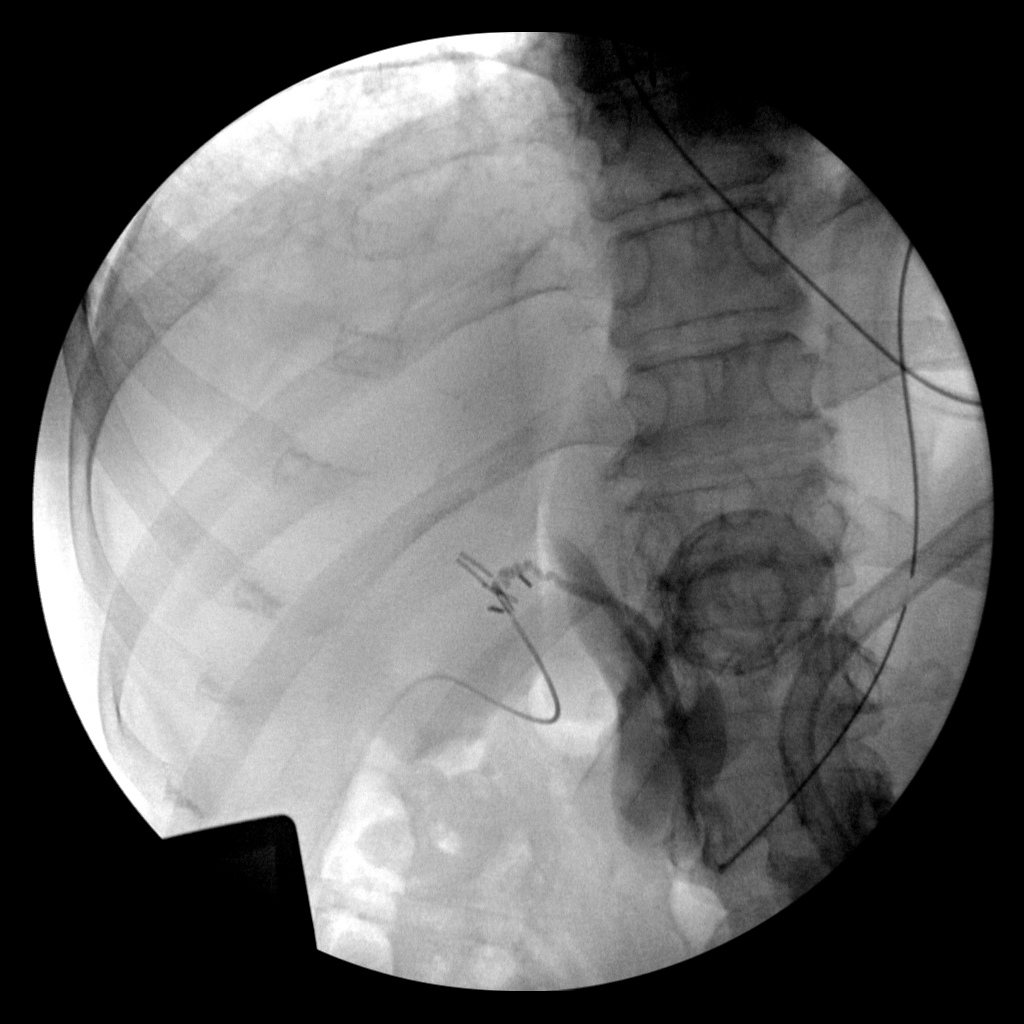
[frame 64/127]
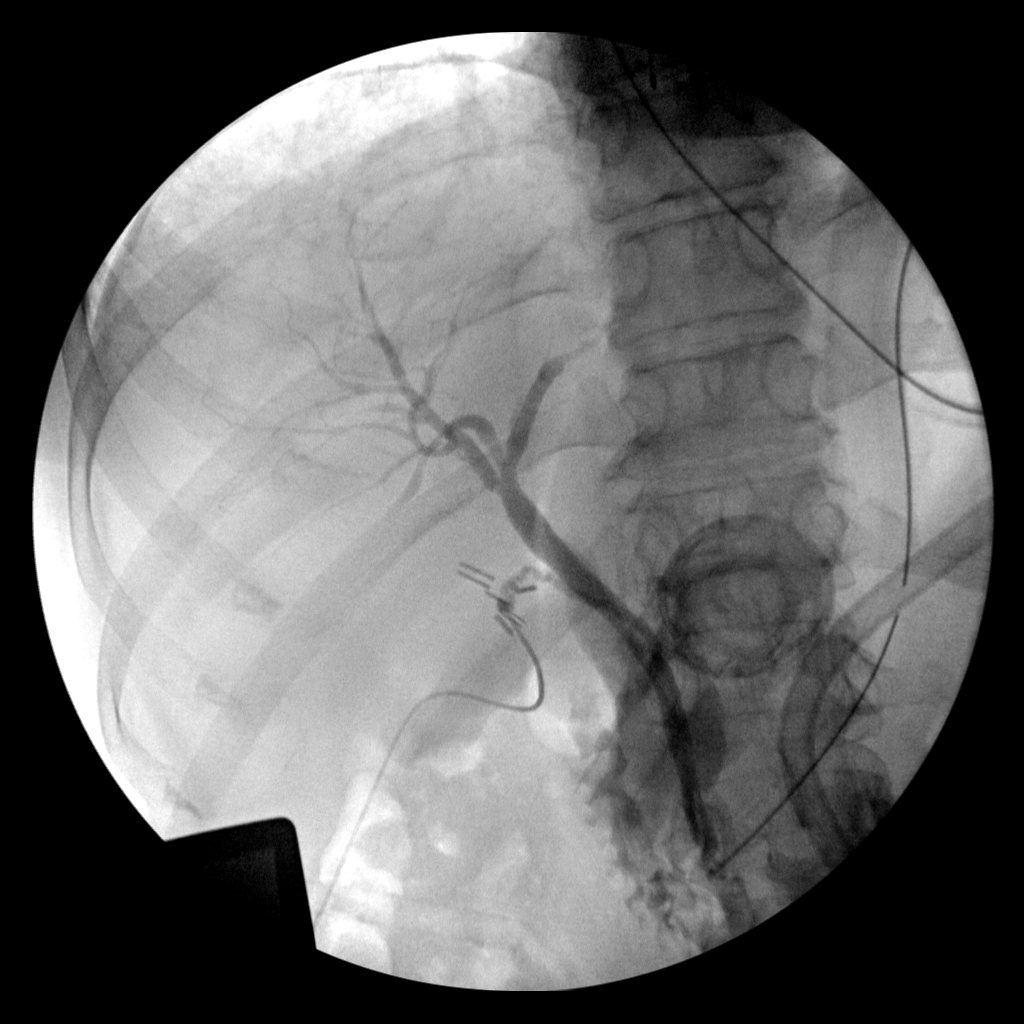
[frame 108/127]
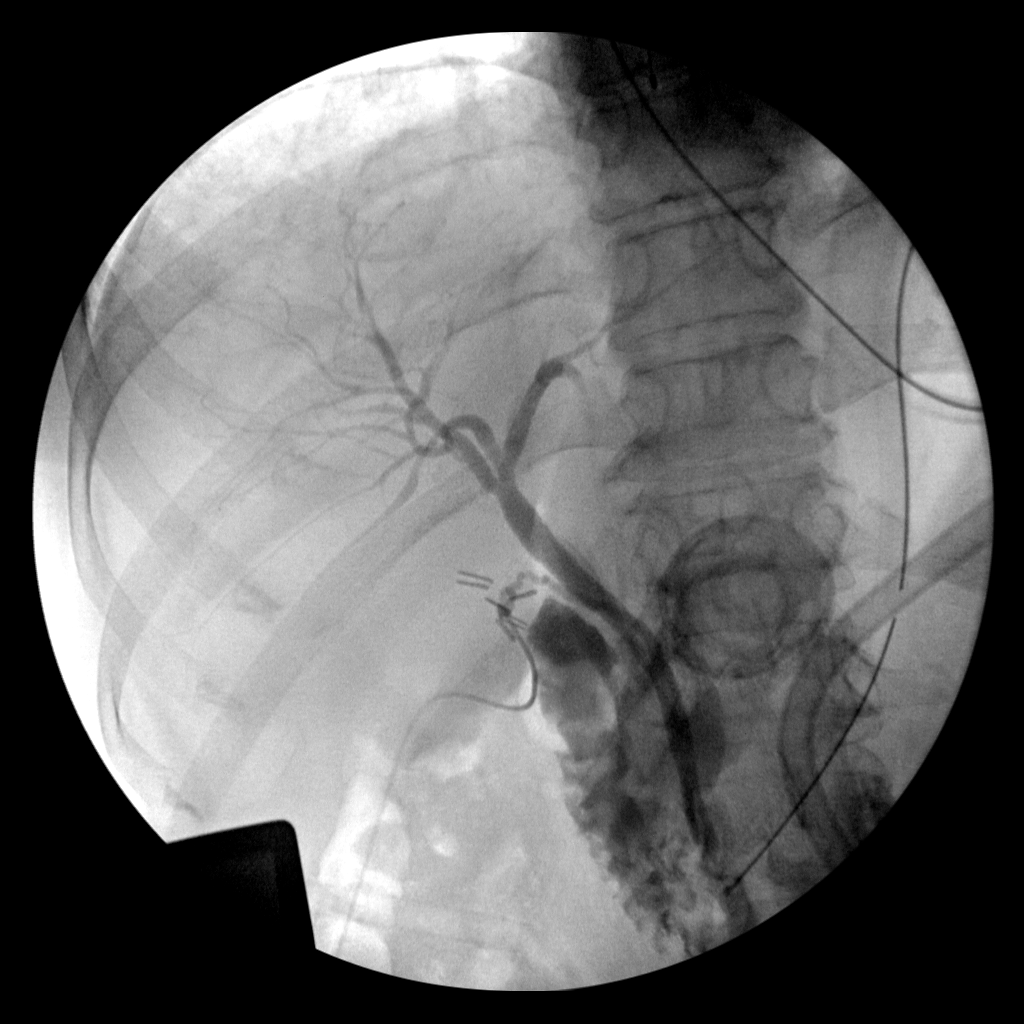

[4 of 4 positions shown; findings below may reference images not displayed]

FINDINGS: Intraoperative angiographic images of the right upper abdominal
quadrant during laparoscopic cholecystectomy are provided for
review.

Surgical clips overlie the expected location of the gallbladder
fossa.

Contrast injection demonstrates selective cannulation of the central
aspect of the cystic duct.

There is passage of contrast through the central aspect of the
cystic duct with filling of a non dilated common bile duct. There is
passage of contrast though the CBD and into the descending portion
of the duodenum.

There is minimal reflux of injected contrast into the common hepatic
duct and central aspect of the non dilated intrahepatic biliary
system.

There are no discrete filling defects within the opacified portions
of the biliary system to suggest the presence of
choledocholithiasis, though evaluation of distal aspect of the CBD
is degraded secondary to overlying osseous structures as well as the
tip of the enteric tube.
IMPRESSION: No definite evidence of choledocholithiasis.

## 2016-09-24 ENCOUNTER — Other Ambulatory Visit: Payer: Self-pay | Admitting: Internal Medicine

## 2016-09-24 DIAGNOSIS — E041 Nontoxic single thyroid nodule: Secondary | ICD-10-CM

## 2016-10-02 ENCOUNTER — Other Ambulatory Visit: Payer: Medicare Other

## 2016-10-07 ENCOUNTER — Ambulatory Visit
Admission: RE | Admit: 2016-10-07 | Discharge: 2016-10-07 | Disposition: A | Discharge status: Home / Self Care | Payer: Medicare Other | Source: Ambulatory Visit | Attending: Internal Medicine | Admitting: Internal Medicine

## 2016-10-07 DIAGNOSIS — E041 Nontoxic single thyroid nodule: Secondary | ICD-10-CM

## 2017-03-25 DIAGNOSIS — Z780 Asymptomatic menopausal state: Secondary | ICD-10-CM | POA: Diagnosis not present

## 2017-06-02 DIAGNOSIS — J040 Acute laryngitis: Secondary | ICD-10-CM | POA: Diagnosis not present

## 2017-06-02 DIAGNOSIS — J301 Allergic rhinitis due to pollen: Secondary | ICD-10-CM | POA: Diagnosis not present

## 2017-06-02 DIAGNOSIS — J410 Simple chronic bronchitis: Secondary | ICD-10-CM | POA: Diagnosis not present

## 2017-06-02 DIAGNOSIS — J322 Chronic ethmoidal sinusitis: Secondary | ICD-10-CM | POA: Diagnosis not present

## 2017-06-02 DIAGNOSIS — J320 Chronic maxillary sinusitis: Secondary | ICD-10-CM | POA: Diagnosis not present

## 2017-07-02 ENCOUNTER — Encounter (INDEPENDENT_AMBULATORY_CARE_PROVIDER_SITE_OTHER): Payer: Self-pay | Admitting: Orthopaedic Surgery

## 2017-07-02 ENCOUNTER — Ambulatory Visit (INDEPENDENT_AMBULATORY_CARE_PROVIDER_SITE_OTHER): Payer: Medicare HMO | Admitting: Orthopaedic Surgery

## 2017-07-02 VITALS — Ht 61.5 in | Wt 146.0 lb

## 2017-07-02 DIAGNOSIS — M79642 Pain in left hand: Secondary | ICD-10-CM

## 2017-07-02 MED ORDER — DICLOFENAC SODIUM 1 % TD GEL: 2.0000 g | Freq: Four times a day (QID) | TRANSDERMAL | 3 refills | Status: DC

## 2017-07-02 NOTE — Progress Notes (Signed)
Office Visit Note   Patient: Sabrina Levy           Date of Birth: Jul 22, 1945           MRN: [id number] Visit Date: 07/02/2017              Requested by: Shon Baton, MD [address] [place],  [address] PCP: Shon Baton, MD   Assessment & Plan: Visit Diagnoses:  1. 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  She is adverse to any type of oral anti-inflammatories or steroids or even injection.  She is a perfect candidate for topical anti-inflammatory such as Voltaren gel.  We will send this in for.  She is also had a history of colon surgery and so she is not a candidate for traditional oral NSAIDs.  All questions concerns were answered and addressed.  She will follow-up as needed.  Follow-Up Instructions: Return if symptoms worsen or fail to improve.   Orders:  No orders of the defined types were placed in this encounter.  Meds ordered this encounter  Medications  . diclofenac sodium (VOLTAREN) 1 % GEL    Sig: Apply 2 g topically 4 (four) times daily.    Dispense:  100 g    Refill:  3      Procedures: No procedures performed   Clinical Data: No additional findings.   Subjective: Chief Complaint  Patient presents with  . Left Hand - Pain  Patient comes in today with left hand pain is been going on for about a month.  She points to the middle finger MCP joint as a source of her pain.  She denies any specific injuries but is been aching with certain activities.  She cannot take traditional anti-inflammatories due to colon Surgery.  Also steroids caused significant flushing.  HPI  Review of Systems She currently denies any headache, chest pain, short of breath, fever, chills, nausea, vomiting.  Objective: Vital Signs: Ht 5' 1.5" (1.562 m)   Wt 146 lb (66.2 kg)   BMI 27.14 kg/m   Physical Exam She is alert and oriented x3 and in no acute distress Ortho  Exam Examination of her left hand shows pain over the MCP joint of her middle and index fingers but more the middle finger.  The exam is otherwise normal of her hand in terms of flexion extension of the digit.  Hyper extension causes significant lateral pain at the MCP joint of the middle finger on her left hand.  She is neurovascular intact otherwise. Specialty Comments:  No specialty comments available.  Imaging: No results found.   PMFS History: Patient Active Problem List   Diagnosis Date Noted  . Biliary dyskinesia 05/06/2014   Past Medical History:  Diagnosis Date  . Arthritis    osteoarthritis   . Cancer ([place])    hx of basal cell skin cancer - 30 yrs ago per patient  . GERD (gastroesophageal reflux disease)    occasional   . H/O motion sickness   . Pneumonia 2013   aspirate pneumonia due to colonoscopy   . Thyroid nodule    small per patient and md just watching   . Urinary tract infection    hx of in 20s     History reviewed. No pertinent family history.  Past Surgical History:  Procedure Laterality Date  . ABDOMINAL HYSTERECTOMY    .  CHOLECYSTECTOMY N/A 05/06/2014   Procedure: LAPAROSCOPIC CHOLECYSTECTOMY WITH INTRAOPERATIVE CHOLANGIOGRAM;  Surgeon: Armandina Gemma, MD;  Location: [facility];  Service: General;  Laterality: N/A;  . COLON RESECTION  2002   . NASAL SINUS SURGERY  2002    Social History   Occupational History  . Not on file  Tobacco Use  . Smoking status: Former Research scientist (life sciences)  . Smokeless tobacco: Never Used  Substance and Sexual Activity  . Alcohol use: No  . Drug use: No  . Sexual activity: Not on file

## 2017-09-11 DIAGNOSIS — D1801 Hemangioma of skin and subcutaneous tissue: Secondary | ICD-10-CM | POA: Diagnosis not present

## 2017-09-11 DIAGNOSIS — L812 Freckles: Secondary | ICD-10-CM | POA: Diagnosis not present

## 2017-09-11 DIAGNOSIS — L570 Actinic keratosis: Secondary | ICD-10-CM | POA: Diagnosis not present

## 2017-09-11 DIAGNOSIS — L821 Other seborrheic keratosis: Secondary | ICD-10-CM | POA: Diagnosis not present

## 2017-09-11 DIAGNOSIS — D2361 Other benign neoplasm of skin of right upper limb, including shoulder: Secondary | ICD-10-CM | POA: Diagnosis not present

## 2017-09-11 DIAGNOSIS — Z85828 Personal history of other malignant neoplasm of skin: Secondary | ICD-10-CM | POA: Diagnosis not present

## 2017-09-30 DIAGNOSIS — E7849 Other hyperlipidemia: Secondary | ICD-10-CM | POA: Diagnosis not present

## 2017-09-30 DIAGNOSIS — R82998 Other abnormal findings in urine: Secondary | ICD-10-CM | POA: Diagnosis not present

## 2017-09-30 DIAGNOSIS — E041 Nontoxic single thyroid nodule: Secondary | ICD-10-CM | POA: Diagnosis not present

## 2017-09-30 DIAGNOSIS — R7309 Other abnormal glucose: Secondary | ICD-10-CM | POA: Diagnosis not present

## 2017-10-07 DIAGNOSIS — K589 Irritable bowel syndrome without diarrhea: Secondary | ICD-10-CM | POA: Diagnosis not present

## 2017-10-07 DIAGNOSIS — E663 Overweight: Secondary | ICD-10-CM | POA: Diagnosis not present

## 2017-10-07 DIAGNOSIS — E041 Nontoxic single thyroid nodule: Secondary | ICD-10-CM | POA: Diagnosis not present

## 2017-10-07 DIAGNOSIS — E7849 Other hyperlipidemia: Secondary | ICD-10-CM | POA: Diagnosis not present

## 2017-10-07 DIAGNOSIS — I781 Nevus, non-neoplastic: Secondary | ICD-10-CM | POA: Diagnosis not present

## 2017-10-07 DIAGNOSIS — Z23 Encounter for immunization: Secondary | ICD-10-CM | POA: Diagnosis not present

## 2017-10-07 DIAGNOSIS — Z1212 Encounter for screening for malignant neoplasm of rectum: Secondary | ICD-10-CM | POA: Diagnosis not present

## 2017-10-07 DIAGNOSIS — Z0000 Encounter for general adult medical examination without abnormal findings: Secondary | ICD-10-CM | POA: Diagnosis not present

## 2017-10-07 DIAGNOSIS — K5790 Diverticulosis of intestine, part unspecified, without perforation or abscess without bleeding: Secondary | ICD-10-CM | POA: Diagnosis not present

## 2017-10-07 DIAGNOSIS — K5909 Other constipation: Secondary | ICD-10-CM | POA: Diagnosis not present

## 2017-10-07 DIAGNOSIS — R140 Abdominal distension (gaseous): Secondary | ICD-10-CM | POA: Diagnosis not present

## 2017-10-07 DIAGNOSIS — M1990 Unspecified osteoarthritis, unspecified site: Secondary | ICD-10-CM | POA: Diagnosis not present

## 2017-10-23 DIAGNOSIS — Z6827 Body mass index (BMI) 27.0-27.9, adult: Secondary | ICD-10-CM | POA: Diagnosis not present

## 2017-10-23 DIAGNOSIS — R42 Dizziness and giddiness: Secondary | ICD-10-CM | POA: Diagnosis not present

## 2017-10-23 DIAGNOSIS — H539 Unspecified visual disturbance: Secondary | ICD-10-CM | POA: Diagnosis not present

## 2017-10-23 DIAGNOSIS — R51 Headache: Secondary | ICD-10-CM | POA: Diagnosis not present

## 2017-10-24 ENCOUNTER — Other Ambulatory Visit: Payer: Self-pay | Admitting: Internal Medicine

## 2017-10-24 ENCOUNTER — Ambulatory Visit
Admission: RE | Admit: 2017-10-24 | Discharge: 2017-10-24 | Disposition: A | Discharge status: Home / Self Care | Payer: Medicare HMO | Source: Ambulatory Visit | Attending: Internal Medicine | Admitting: Internal Medicine

## 2017-10-24 DIAGNOSIS — R42 Dizziness and giddiness: Secondary | ICD-10-CM

## 2017-10-24 DIAGNOSIS — R519 Headache, unspecified: Secondary | ICD-10-CM

## 2017-10-24 DIAGNOSIS — R51 Headache: Secondary | ICD-10-CM | POA: Diagnosis not present

## 2017-10-28 ENCOUNTER — Other Ambulatory Visit: Payer: Self-pay | Admitting: Family Medicine

## 2017-10-28 DIAGNOSIS — H8143 Vertigo of central origin, bilateral: Secondary | ICD-10-CM | POA: Diagnosis not present

## 2017-11-07 DIAGNOSIS — H524 Presbyopia: Secondary | ICD-10-CM | POA: Diagnosis not present

## 2017-11-07 DIAGNOSIS — H5203 Hypermetropia, bilateral: Secondary | ICD-10-CM | POA: Diagnosis not present

## 2017-11-07 DIAGNOSIS — H2513 Age-related nuclear cataract, bilateral: Secondary | ICD-10-CM | POA: Diagnosis not present

## 2017-12-03 ENCOUNTER — Ambulatory Visit (INDEPENDENT_AMBULATORY_CARE_PROVIDER_SITE_OTHER): Payer: Medicare HMO

## 2017-12-03 ENCOUNTER — Ambulatory Visit (INDEPENDENT_AMBULATORY_CARE_PROVIDER_SITE_OTHER): Payer: Medicare HMO | Admitting: Physician Assistant

## 2017-12-03 ENCOUNTER — Encounter (INDEPENDENT_AMBULATORY_CARE_PROVIDER_SITE_OTHER): Payer: Self-pay | Admitting: Physician Assistant

## 2017-12-03 DIAGNOSIS — M533 Sacrococcygeal disorders, not elsewhere classified: Secondary | ICD-10-CM

## 2017-12-03 DIAGNOSIS — M542 Cervicalgia: Secondary | ICD-10-CM

## 2017-12-03 DIAGNOSIS — G8929 Other chronic pain: Secondary | ICD-10-CM

## 2017-12-03 DIAGNOSIS — N951 Menopausal and female climacteric states: Secondary | ICD-10-CM | POA: Insufficient documentation

## 2017-12-03 DIAGNOSIS — R319 Hematuria, unspecified: Secondary | ICD-10-CM | POA: Insufficient documentation

## 2017-12-03 NOTE — Progress Notes (Signed)
Office Visit Note   Patient: Sabrina Levy           Date of Birth: [birth year]           MRN: [id number] Visit Date: 12/03/2017              Requested by: Shon Baton, MD [address] [place], [place] [address] PCP: Shon Baton, MD   Assessment & Plan: Visit Diagnoses:  1. Chronic left SI joint pain   2. Cervicalgia     Plan: Due to patient's inability to take NSAIDs and the fact that she states she has had facial swelling did not like the effects of Medrol Dosepak would not recommend any medications at this time.  Therefore we will send her to formal physical therapy for her SI joint pain on the left hand for her cervical algia.  Physical therapy is to evaluate and treat her include any modalities, home exercise program, and stretching exercises.  Did discuss with her an SI joint injection on the left she defers.  She may benefit from this in the future.  We will see her back in 1 month check her progress or lack of.  Follow-Up Instructions: Return in about 4 weeks (around 12/31/2017).   Orders:  Orders Placed This Encounter  Procedures  . XR Cervical Spine 2 or 3 views  . XR Lumbar Spine 2-3 Views   No orders of the defined types were placed in this encounter.     Procedures: No procedures performed   Clinical Data: No additional findings.   Subjective: Chief Complaint  Patient presents with  . Lower Back - Pain  . Neck - Other    Pt states a pulling feeling  . Left Leg - Pain    HPI  Sabrina Levy is well-known to Dr. Ninfa Linden service comes in today due to some lower back pain on the left and neck discomfort.  She states that she is having some low back pain points to her SI joint region.'s been with lifting heavy objects.  She has had some episodes at night where she has severe pain in the lower back SI joint region.  Pain is worse whenever she first gets up from sitting position walks out but still she has some discomfort in her lower back SI joint region.   She has had no waking pain no bowel bladder dysfunction.  Denies any radicular symptoms down either leg.  In regards to her neck she has some discomfort in her knee neck is that it feels as if it catches.  Jerking-like sensation in the neck.  She denies any numbness tingling down either arm. MRI of her cervical spine in 2014 showed C4/C5 disc bulge with mild cord flattening.  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  No cord deformity.  Review of Systems  Constitutional: Negative for chills and fever.  Respiratory: Negative for shortness of breath.   Cardiovascular: Negative for chest pain.  Musculoskeletal: Positive for back pain.     Objective: Vital Signs: There were no vitals taken for this visit.  Physical Exam  Constitutional: She is oriented to person, place, and time. She appears well-developed and well-nourished. No distress.  Pulmonary/Chest: Effort normal.  Neurological: She is alert and oriented to person, place, and time.  Psychiatric: She has a normal mood and affect.    Ortho Exam Cervical spine she has good range of motion without pain.  Negative Spurling's  test.  No tenderness along medial borders of bilateral scapula.  5 out of 5 strength throughout the upper extremities against resistance. Lower extremities 5 out of 5 strength throughout.  Negative straight leg raise bilaterally.  She has no tenderness about the lumbar spine paraspinous region or over the SI joint region.  Sensation intact bilateral feet to light touch dorsal pedal pulses are 2+ bilaterally and equal symmetric.  Calves are supple nontender.  Specialty Comments:  No specialty comments available.  Imaging: Xr Cervical Spine 2 Or 3 Views  Result Date: 12/03/2017 Cervical spine loss of normal lordotic curvature.  No acute fractures.  Disc spaces otherwise well-maintained.  Xr Lumbar Spine 2-3 Views  Result Date: 12/03/2017 Lumbar spine 2  views: Mild retrolisthesis of L2 on L3.  Narrowing of L2-L3 disc space.  Arthrosclerosis of aorta noted.  No acute fracture or bony abnormalities otherwise    PMFS History: Patient Active Problem List   Diagnosis Date Noted  . Blood in urine 12/03/2017  . Menopausal symptom 12/03/2017  . Biliary dyskinesia 05/06/2014   Past Medical History:  Diagnosis Date  . Arthritis    osteoarthritis   . Cancer ([place])    hx of basal cell skin cancer - 30 yrs ago per patient  . GERD (gastroesophageal reflux disease)    occasional   . H/O motion sickness   . Pneumonia 2013   aspirate pneumonia due to colonoscopy   . Thyroid nodule    small per patient and md just watching   . Urinary tract infection    hx of in 20s     History reviewed. No pertinent family history.  Past Surgical History:  Procedure Laterality Date  . ABDOMINAL HYSTERECTOMY    . CHOLECYSTECTOMY N/A 05/06/2014   Procedure: LAPAROSCOPIC CHOLECYSTECTOMY WITH INTRAOPERATIVE CHOLANGIOGRAM;  Surgeon: Armandina Gemma, MD;  Location: [facility];  Service: General;  Laterality: N/A;  . COLON RESECTION  2002   . NASAL SINUS SURGERY  2002    Social History   Occupational History  . Not on file  Tobacco Use  . Smoking status: Former Research scientist (life sciences)  . Smokeless tobacco: Never Used  Substance and Sexual Activity  . Alcohol use: No  . Drug use: No  . Sexual activity: Not on file

## 2017-12-08 DIAGNOSIS — K5793 Diverticulitis of intestine, part unspecified, without perforation or abscess with bleeding: Secondary | ICD-10-CM | POA: Diagnosis not present

## 2017-12-08 DIAGNOSIS — R1032 Left lower quadrant pain: Secondary | ICD-10-CM | POA: Diagnosis not present

## 2017-12-17 DIAGNOSIS — R69 Illness, unspecified: Secondary | ICD-10-CM | POA: Diagnosis not present

## 2017-12-23 ENCOUNTER — Telehealth (INDEPENDENT_AMBULATORY_CARE_PROVIDER_SITE_OTHER): Payer: Self-pay | Admitting: Physician Assistant

## 2017-12-23 NOTE — Telephone Encounter (Signed)
FYI-see below- 

## 2017-12-23 NOTE — Telephone Encounter (Signed)
Patient called, states she saw Artis Delay on 9/18 for SI pain. And come to find out later she had diverticulitis which was what was causing her the pain. Due to that being the cause, she did not go to Physical Therapy. She wanted Artis Delay to know this and also documented in her chart. pts callback number [phone]

## 2018-01-01 ENCOUNTER — Ambulatory Visit (INDEPENDENT_AMBULATORY_CARE_PROVIDER_SITE_OTHER): Payer: Medicare HMO | Admitting: Physician Assistant

## 2018-04-17 DIAGNOSIS — I788 Other diseases of capillaries: Secondary | ICD-10-CM | POA: Diagnosis not present

## 2018-04-17 DIAGNOSIS — L570 Actinic keratosis: Secondary | ICD-10-CM | POA: Diagnosis not present

## 2018-04-17 DIAGNOSIS — Z85828 Personal history of other malignant neoplasm of skin: Secondary | ICD-10-CM | POA: Diagnosis not present

## 2018-04-17 DIAGNOSIS — L821 Other seborrheic keratosis: Secondary | ICD-10-CM | POA: Diagnosis not present

## 2018-04-20 DIAGNOSIS — Z1231 Encounter for screening mammogram for malignant neoplasm of breast: Secondary | ICD-10-CM | POA: Diagnosis not present

## 2018-04-20 DIAGNOSIS — Z01419 Encounter for gynecological examination (general) (routine) without abnormal findings: Secondary | ICD-10-CM | POA: Diagnosis not present

## 2018-09-15 DIAGNOSIS — R69 Illness, unspecified: Secondary | ICD-10-CM | POA: Diagnosis not present

## 2018-09-21 ENCOUNTER — Telehealth: Payer: Self-pay | Admitting: Orthopaedic Surgery

## 2018-09-21 ENCOUNTER — Other Ambulatory Visit: Payer: Self-pay

## 2018-09-21 MED ORDER — DICLOFENAC SODIUM 1 % TD GEL: 2.0000 g | Freq: Four times a day (QID) | TRANSDERMAL | 3 refills | Status: DC

## 2018-09-21 NOTE — Telephone Encounter (Signed)
Patient would like an rx for Voltaren gel to be sent into [facility] on [address]. Patient's last rx has expired. Patient aware it has gone OTC, but wants rx.

## 2018-09-21 NOTE — Telephone Encounter (Signed)
Sent to [facility].  

## 2018-09-22 DIAGNOSIS — D2362 Other benign neoplasm of skin of left upper limb, including shoulder: Secondary | ICD-10-CM | POA: Diagnosis not present

## 2018-09-22 DIAGNOSIS — L570 Actinic keratosis: Secondary | ICD-10-CM | POA: Diagnosis not present

## 2018-09-22 DIAGNOSIS — L564 Polymorphous light eruption: Secondary | ICD-10-CM | POA: Diagnosis not present

## 2018-09-22 DIAGNOSIS — L821 Other seborrheic keratosis: Secondary | ICD-10-CM | POA: Diagnosis not present

## 2018-09-22 DIAGNOSIS — Z85828 Personal history of other malignant neoplasm of skin: Secondary | ICD-10-CM | POA: Diagnosis not present

## 2018-09-22 DIAGNOSIS — D1801 Hemangioma of skin and subcutaneous tissue: Secondary | ICD-10-CM | POA: Diagnosis not present

## 2018-09-23 ENCOUNTER — Ambulatory Visit (INDEPENDENT_AMBULATORY_CARE_PROVIDER_SITE_OTHER): Payer: Medicare HMO

## 2018-09-23 ENCOUNTER — Other Ambulatory Visit: Payer: Self-pay

## 2018-09-23 ENCOUNTER — Encounter: Payer: Self-pay | Admitting: Orthopaedic Surgery

## 2018-09-23 ENCOUNTER — Ambulatory Visit (INDEPENDENT_AMBULATORY_CARE_PROVIDER_SITE_OTHER): Payer: Medicare HMO | Admitting: Orthopaedic Surgery

## 2018-09-23 DIAGNOSIS — M7712 Lateral epicondylitis, left elbow: Secondary | ICD-10-CM

## 2018-09-23 DIAGNOSIS — M25512 Pain in left shoulder: Secondary | ICD-10-CM

## 2018-09-23 DIAGNOSIS — M542 Cervicalgia: Secondary | ICD-10-CM

## 2018-09-23 MED ORDER — LIDOCAINE HCL 1 % IJ SOLN
3.0000 mL | INTRAMUSCULAR | Status: AC | PRN
  Administered 2018-09-23: 3 mL

## 2018-09-23 MED ORDER — METHYLPREDNISOLONE ACETATE 40 MG/ML IJ SUSP
40.0000 mg | INTRAMUSCULAR | Status: AC | PRN
Start: 2018-09-23 — End: 2018-09-23
  Administered 2018-09-23: 40 mg via INTRA_ARTICULAR

## 2018-09-23 MED ORDER — DICLOFENAC SODIUM 1 % TD GEL: 2.0000 g | Freq: Four times a day (QID) | TRANSDERMAL | 3 refills | Status: DC

## 2018-09-23 NOTE — Progress Notes (Signed)
Office Visit Note   Patient: Sabrina Levy           Date of Birth: 05/10/1945           MRN: [id number] Visit Date: 09/23/2018              Requested by: Shon Baton, [place],  [place] [address] PCP: Shon Baton, MD   Assessment & Plan: Visit Diagnoses:  1. Left shoulder pain, unspecified chronicity   2. Neck pain   3. Lateral epicondylitis, left elbow     Plan: It was certainly worth trying a steroid injection in her left shoulder subacromial space to see if this will calm down her symptoms.  She is definitely candidate for Voltaren gel given her diverticulosis and diverticulitis.  She cannot take or tolerate oral anti-inflammatories.  I showed her stretching exercises to try for her lateral epicondylitis as well.  All question concerns were answered and addressed.  Follow-up will be as needed.  Follow-Up Instructions: Return if symptoms worsen or fail to improve.   Orders:  Orders Placed This Encounter  Procedures  . Large Joint Inj  . XR Shoulder Left  . XR Cervical Spine 2 or 3 views   No orders of the defined types were placed in this encounter.     Procedures: Large Joint Inj: L subacromial bursa on 09/23/2018 4:54 PM Indications: pain and diagnostic evaluation Details: 22 G 1.5 in needle  Arthrogram: No  Medications: 3 mL lidocaine 1 %; 40 mg methylPREDNISolone acetate 40 MG/ML Outcome: tolerated well, no immediate complications Procedure, treatment alternatives, risks and benefits explained, specific risks discussed. Consent was given by the patient. Immediately prior to procedure a time out was called to verify the correct patient, procedure, equipment, support staff and site/side marked as required. Patient was prepped and draped in the usual sterile fashion.       Clinical Data: No additional findings.   Subjective: Chief Complaint  Patient presents with  . Left Shoulder - Pain  Patient is actually well-known to [place].  I seen  her husband for many years.  She comes in with left shoulder pain and some neck pain as well as elbow pain.  The elbow pain seems to be over the lateral epicondyle area that is where she points she denies any numbness tingling in her hand.  She also has pain with overhead activities and reaching behind her but that left shoulder.  She denies any specific injury to the shoulder or arm.  She has had a little bit of neck pain to the left side as well.  She cannot take anti-inflammatories due to diverticular disease.  HPI  Review of Systems She currently denies any headache, chest pain, shortness of breath, fever, chills, nausea, vomiting  Objective: Vital Signs: There were no vitals taken for this visit.  Physical Exam She is alert and orient x3 and in no acute distress Ortho Exam Examination of her left elbow shows pain only over the lateral epicondyle area.  The elbow is ligamentously stable full range of motion.  Her hand exam is entirely normal on the left side.  She hurts in the bicipital groove of her left shoulder and with overhead motion as well as reaching behind her but there is no deficits that I can tell the rotator cuff in the shoulder is well located.  She has some pain with range of motion of her neck but is minimal.  There is no weakness  in either hand. Specialty Comments:  No specialty comments available.  Imaging: Xr Cervical Spine 2 Or 3 Views  Result Date: 09/23/2018 2 views of the cervical spine show no acute findings.  There is no significant malalignment.  There is some mild degenerative changes.  Xr Shoulder Left  Result Date: 09/23/2018 3 views of the left shoulder show well located shoulder.    PMFS History: Patient Active Problem List   Diagnosis Date Noted  . Blood in urine 12/03/2017  . Menopausal symptom 12/03/2017  . Biliary dyskinesia 05/06/2014   Past Medical History:  Diagnosis Date  . Arthritis    osteoarthritis   . Cancer ([place])    hx of basal  cell skin cancer - 30 yrs ago per patient  . GERD (gastroesophageal reflux disease)    occasional   . H/O motion sickness   . Pneumonia 2013   aspirate pneumonia due to colonoscopy   . Thyroid nodule    small per patient and md just watching   . Urinary tract infection    hx of in 20s     History reviewed. No pertinent family history.  Past Surgical History:  Procedure Laterality Date  . ABDOMINAL HYSTERECTOMY    . CHOLECYSTECTOMY N/A 05/06/2014   Procedure: LAPAROSCOPIC CHOLECYSTECTOMY WITH INTRAOPERATIVE CHOLANGIOGRAM;  Surgeon: Armandina Gemma, MD;  Location: [facility];  Service: General;  Laterality: N/A;  . COLON RESECTION  2002   . NASAL SINUS SURGERY  2002    Social History   Occupational History  . Not on file  Tobacco Use  . Smoking status: Former Research scientist (life sciences)  . Smokeless tobacco: Never Used  Substance and Sexual Activity  . Alcohol use: No  . Drug use: No  . Sexual activity: Not on file

## 2018-09-24 ENCOUNTER — Telehealth: Payer: Self-pay | Admitting: Physician Assistant

## 2018-09-24 NOTE — Telephone Encounter (Signed)
Received voicemail message from patient stating she spoke in error and wanted to clarify that the Diclofenac Sodium over the counter amount in the tube was 1 half of the amount and not 1 half of the strength. Patient said to contact her if more clarification was needed. The number to contact patient is [phone]

## 2018-09-25 ENCOUNTER — Telehealth: Payer: Self-pay | Admitting: Orthopaedic Surgery

## 2018-09-25 NOTE — Telephone Encounter (Signed)
Patient called to state received your call and she received  medication and wanted to thank you all for the help. The cortisone shot was the way to go.

## 2018-09-25 NOTE — Telephone Encounter (Signed)
FYI

## 2018-09-25 NOTE — Telephone Encounter (Signed)
[facility] patient 

## 2018-10-05 DIAGNOSIS — R739 Hyperglycemia, unspecified: Secondary | ICD-10-CM | POA: Diagnosis not present

## 2018-10-05 DIAGNOSIS — E041 Nontoxic single thyroid nodule: Secondary | ICD-10-CM | POA: Diagnosis not present

## 2018-10-05 DIAGNOSIS — E7849 Other hyperlipidemia: Secondary | ICD-10-CM | POA: Diagnosis not present

## 2018-10-08 DIAGNOSIS — R82998 Other abnormal findings in urine: Secondary | ICD-10-CM | POA: Diagnosis not present

## 2018-10-12 DIAGNOSIS — Z1339 Encounter for screening examination for other mental health and behavioral disorders: Secondary | ICD-10-CM | POA: Diagnosis not present

## 2018-10-12 DIAGNOSIS — E663 Overweight: Secondary | ICD-10-CM | POA: Diagnosis not present

## 2018-10-12 DIAGNOSIS — K5790 Diverticulosis of intestine, part unspecified, without perforation or abscess without bleeding: Secondary | ICD-10-CM | POA: Diagnosis not present

## 2018-10-12 DIAGNOSIS — Z1331 Encounter for screening for depression: Secondary | ICD-10-CM | POA: Diagnosis not present

## 2018-10-12 DIAGNOSIS — R42 Dizziness and giddiness: Secondary | ICD-10-CM | POA: Diagnosis not present

## 2018-10-12 DIAGNOSIS — I781 Nevus, non-neoplastic: Secondary | ICD-10-CM | POA: Diagnosis not present

## 2018-10-12 DIAGNOSIS — K589 Irritable bowel syndrome without diarrhea: Secondary | ICD-10-CM | POA: Diagnosis not present

## 2018-10-12 DIAGNOSIS — M1990 Unspecified osteoarthritis, unspecified site: Secondary | ICD-10-CM | POA: Diagnosis not present

## 2018-10-12 DIAGNOSIS — R140 Abdominal distension (gaseous): Secondary | ICD-10-CM | POA: Diagnosis not present

## 2018-10-12 DIAGNOSIS — K5900 Constipation, unspecified: Secondary | ICD-10-CM | POA: Diagnosis not present

## 2018-10-12 DIAGNOSIS — Z0000 Encounter for general adult medical examination without abnormal findings: Secondary | ICD-10-CM | POA: Diagnosis not present

## 2018-10-12 DIAGNOSIS — E785 Hyperlipidemia, unspecified: Secondary | ICD-10-CM | POA: Diagnosis not present

## 2018-10-26 DIAGNOSIS — R519 Headache, unspecified: Secondary | ICD-10-CM | POA: Insufficient documentation

## 2018-10-26 DIAGNOSIS — R51 Headache: Secondary | ICD-10-CM | POA: Diagnosis not present

## 2018-11-12 DIAGNOSIS — H52203 Unspecified astigmatism, bilateral: Secondary | ICD-10-CM | POA: Diagnosis not present

## 2018-11-12 DIAGNOSIS — H524 Presbyopia: Secondary | ICD-10-CM | POA: Diagnosis not present

## 2018-11-12 DIAGNOSIS — H5202 Hypermetropia, left eye: Secondary | ICD-10-CM | POA: Diagnosis not present

## 2018-12-02 DIAGNOSIS — R69 Illness, unspecified: Secondary | ICD-10-CM | POA: Diagnosis not present

## 2019-01-11 DIAGNOSIS — R0609 Other forms of dyspnea: Secondary | ICD-10-CM | POA: Diagnosis not present

## 2019-01-11 DIAGNOSIS — E785 Hyperlipidemia, unspecified: Secondary | ICD-10-CM | POA: Diagnosis not present

## 2019-01-11 DIAGNOSIS — Z8249 Family history of ischemic heart disease and other diseases of the circulatory system: Secondary | ICD-10-CM | POA: Diagnosis not present

## 2019-01-13 DIAGNOSIS — R69 Illness, unspecified: Secondary | ICD-10-CM | POA: Diagnosis not present

## 2019-01-14 ENCOUNTER — Other Ambulatory Visit: Payer: Self-pay

## 2019-01-14 ENCOUNTER — Encounter: Payer: Self-pay | Admitting: Cardiology

## 2019-01-14 ENCOUNTER — Ambulatory Visit (INDEPENDENT_AMBULATORY_CARE_PROVIDER_SITE_OTHER): Payer: Medicare HMO | Admitting: Cardiology

## 2019-01-14 VITALS — BP 145/82 | HR 69 | Wt 149.2 lb

## 2019-01-14 DIAGNOSIS — R079 Chest pain, unspecified: Secondary | ICD-10-CM | POA: Insufficient documentation

## 2019-01-14 DIAGNOSIS — R0602 Shortness of breath: Secondary | ICD-10-CM

## 2019-01-14 DIAGNOSIS — R61 Generalized hyperhidrosis: Secondary | ICD-10-CM

## 2019-01-14 DIAGNOSIS — E782 Mixed hyperlipidemia: Secondary | ICD-10-CM | POA: Diagnosis not present

## 2019-01-14 NOTE — Progress Notes (Signed)
Patient referred by Shon Baton, MD for chest pain, shortness of breath  Subjective:   Sabrina Levy, female    DOB: [birth year], 73 y.o.   MRN: [id number]   Chief Complaint  Patient presents with  . Shortness of Breath     HPI  73 year old Caucasian female with hyperlipidemia, history of basal cell skin cancer, referred for evaluation of shortness of breath.  Few weeks ago, patient was raking leaves after having had 3-4 cups of coffee.  She had sudden onset of shortness of breath and tachycardia, associated with diaphoresis.  She denies having any chest pain.  Symptoms improved after rest.  She felt tired rest of the day.Patient has not had any such episodes before or after this particular episode.  Prior to Covid pandemic, patient was very active working out regularly at the senior center.  Since the start of the pandemic, her overall activity has gone down.  She currently does not do any regular exercise.  Patient endorses eating fried and fatty food.  She does not have any history of prior cardiac illness.  Past Medical History:  Diagnosis Date  . Arthritis    osteoarthritis   . Cancer ([place])    hx of basal cell skin cancer - 30 yrs ago per patient  . GERD (gastroesophageal reflux disease)    occasional   . H/O motion sickness   . Pneumonia 2013   aspirate pneumonia due to colonoscopy   . Thyroid nodule    small per patient and md just watching   . Urinary tract infection    hx of in 20s      Past Surgical History:  Procedure Laterality Date  . ABDOMINAL HYSTERECTOMY    . CHOLECYSTECTOMY N/A 05/06/2014   Procedure: LAPAROSCOPIC CHOLECYSTECTOMY WITH INTRAOPERATIVE CHOLANGIOGRAM;  Surgeon: Armandina Gemma, MD;  Location: [facility];  Service: General;  Laterality: N/A;  . COLON RESECTION  2002   . NASAL SINUS SURGERY  2002      Social History   Socioeconomic History  . Marital status: Married    Spouse name: Not on file  . Number of children: Not on file  . Years of  education: Not on file  . Highest education level: Not on file  Occupational History  . Not on file  Social Needs  . Financial resource strain: Not on file  . Food insecurity    Worry: Not on file    Inability: Not on file  . Transportation needs    Medical: Not on file    Non-medical: Not on file  Tobacco Use  . Smoking status: Former Research scientist (life sciences)  . Smokeless tobacco: Never Used  Substance and Sexual Activity  . Alcohol use: No  . Drug use: No  . Sexual activity: Not on file  Lifestyle  . Physical activity    Days per week: Not on file    Minutes per session: Not on file  . Stress: Not on file  Relationships  . Social Herbalist on phone: Not on file    Gets together: Not on file    Attends religious service: Not on file    Active member of club or organization: Not on file    Attends meetings of clubs or organizations: Not on file    Relationship status: Not on file  . Intimate partner violence    Fear of current or ex partner: Not on file    Emotionally abused: Not on file  Physically abused: Not on file    Forced sexual activity: Not on file  Other Topics Concern  . Not on file  Social History Narrative  . Not on file     Family History  Problem Relation Age of Onset  . Heart attack Father 19  . Heart disease Brother 37       Valve replacement     Current Outpatient Medications on File Prior to Visit  Medication Sig Dispense Refill  . acetaminophen (TYLENOL) 500 MG tablet Take 500 mg by mouth every 6 (six) hours as needed for mild pain.    Marland Kitchen atorvastatin (LIPITOR) 20 MG tablet     . clotrimazole-betamethasone (LOTRISONE) cream clotrimazole-betamethasone 1 %-0.05 % topical cream  APPLY TO AFFECTED AND SURROUNDING SKIN 2 TIMES A DAY FOR 2 WEEKS    . polyethylene glycol (MIRALAX / GLYCOLAX) packet Take 17 g by mouth daily as needed for mild constipation.    . valACYclovir (VALTREX) 1000 MG tablet valacyclovir 1 gram tablet     No current  facility-administered medications on file prior to visit.     Cardiovascular studies:  Outside EKG 01/07/2019: Sinus rhythm 67 bpm. Normal EKG   Recent labs: 10/05/2018: Glucose 97.  BUN/creatinine 15/0.8.  GFR 69.  Sodium 139, potassium 4.7.  Rest of the CMP normal. H/H 14/?.  MCV 92.  Platelets 357. TSH normal. Hemoglobin A1c 5.4% Cholesterol 199, triglycerides 72, HDL 64, LDL 121.   Review of Systems  Constitution: Negative for decreased appetite, malaise/fatigue, weight gain and weight loss.  HENT: Negative for congestion.   Eyes: Negative for visual disturbance.  Cardiovascular: Negative for chest pain, dyspnea on exertion, leg swelling, palpitations and syncope.       Episode of shortness of breath, diaphoresis-as per HPI  Respiratory: Negative for cough.   Endocrine: Negative for cold intolerance.  Hematologic/Lymphatic: Does not bruise/bleed easily.  Skin: Negative for itching and rash.  Musculoskeletal: Negative for myalgias.  Gastrointestinal: Negative for abdominal pain, nausea and vomiting.  Genitourinary: Negative for dysuria.  Neurological: Negative for dizziness and weakness.  Psychiatric/Behavioral: The patient is not nervous/anxious.   All other systems reviewed and are negative.        Vitals:   01/14/19 1442  BP: (!) 145/82  Pulse: 69     Body mass index is 27.73 kg/m. Filed Weights   01/14/19 1442  Weight: 67.7 kg     Objective:   Physical Exam  Constitutional: She is oriented to person, place, and time. She appears well-developed and well-nourished. No distress.  HENT:  Head: Normocephalic and atraumatic.  Eyes: Pupils are equal, round, and reactive to light. Conjunctivae are normal.  Neck: No JVD present.  Cardiovascular: Normal rate, regular rhythm and intact distal pulses.  Murmur heard.  Systolic murmur is present with a grade of 1/6 at the lower right sternal border. Pulmonary/Chest: Effort normal and breath sounds normal. She  has no wheezes. She has no rales.  Abdominal: Soft. Bowel sounds are normal. There is no rebound.  Musculoskeletal:        General: No edema.  Lymphadenopathy:    She has no cervical adenopathy.  Neurological: She is alert and oriented to person, place, and time. No cranial nerve deficit.  Skin: Skin is warm and dry.  Psychiatric: She has a normal mood and affect.  Nursing note and vitals reviewed.         Assessment & Recommendations:   73 year old Caucasian female with hyperlipidemia, history of basal cell  skin cancer, referred for evaluation of shortness of breath.  Shortness of breath, diaphoresis: Episode during visit activity concerning for angina.  Given her risk factors of age, hyperlipidemia, former smoker, and family history of coronary artery disease, recommend exercise nuclear stress test and echocardiogram.  Continue current medical therapy.   Hyperlipidemia: Continue Lipitor 10 mg daily for now.  We will discuss further changes, if necessary, after the stress test.  Discussed diet and lifestyle changes, particularly reducing intake of fried and fatty food.   Thank you for referring the patient to [place]. Please feel free to contact with any questions.  Nigel Mormon, MD [facility] Pager: [phone] Office: [phone] If no answer Cell [phone]

## 2019-01-20 ENCOUNTER — Ambulatory Visit (INDEPENDENT_AMBULATORY_CARE_PROVIDER_SITE_OTHER): Payer: Medicare HMO

## 2019-01-20 ENCOUNTER — Other Ambulatory Visit: Payer: Self-pay | Admitting: Cardiology

## 2019-01-20 ENCOUNTER — Other Ambulatory Visit: Payer: Self-pay

## 2019-01-20 DIAGNOSIS — R0602 Shortness of breath: Secondary | ICD-10-CM | POA: Diagnosis not present

## 2019-01-21 NOTE — Progress Notes (Signed)
Pt aware of results and pending appt.//ah

## 2019-01-29 ENCOUNTER — Other Ambulatory Visit: Payer: Medicare HMO

## 2019-02-10 ENCOUNTER — Other Ambulatory Visit: Payer: Self-pay | Admitting: Cardiology

## 2019-02-10 DIAGNOSIS — E782 Mixed hyperlipidemia: Secondary | ICD-10-CM

## 2019-02-10 DIAGNOSIS — R0602 Shortness of breath: Secondary | ICD-10-CM

## 2019-02-10 DIAGNOSIS — R61 Generalized hyperhidrosis: Secondary | ICD-10-CM

## 2019-02-10 NOTE — Progress Notes (Unsigned)
Since we are holding off doing any exercise stress test due to COVID risks, I have switched the test to pharmacological nuclear stress test

## 2019-02-15 ENCOUNTER — Ambulatory Visit (INDEPENDENT_AMBULATORY_CARE_PROVIDER_SITE_OTHER): Payer: Medicare HMO

## 2019-02-15 ENCOUNTER — Other Ambulatory Visit: Payer: Self-pay

## 2019-02-15 DIAGNOSIS — R0602 Shortness of breath: Secondary | ICD-10-CM | POA: Diagnosis not present

## 2019-02-15 DIAGNOSIS — E782 Mixed hyperlipidemia: Secondary | ICD-10-CM | POA: Diagnosis not present

## 2019-02-15 DIAGNOSIS — R61 Generalized hyperhidrosis: Secondary | ICD-10-CM | POA: Diagnosis not present

## 2019-02-24 ENCOUNTER — Telehealth (INDEPENDENT_AMBULATORY_CARE_PROVIDER_SITE_OTHER): Payer: Medicare HMO | Admitting: Cardiology

## 2019-02-24 ENCOUNTER — Other Ambulatory Visit: Payer: Self-pay

## 2019-02-24 ENCOUNTER — Encounter: Payer: Self-pay | Admitting: Cardiology

## 2019-02-24 VITALS — BP 131/79 | HR 81 | Ht 61.5 in | Wt 137.0 lb

## 2019-02-24 DIAGNOSIS — E782 Mixed hyperlipidemia: Secondary | ICD-10-CM | POA: Diagnosis not present

## 2019-02-24 DIAGNOSIS — R0602 Shortness of breath: Secondary | ICD-10-CM

## 2019-02-24 DIAGNOSIS — I10 Essential (primary) hypertension: Secondary | ICD-10-CM

## 2019-02-24 NOTE — Progress Notes (Signed)
Patient referred by Shon Baton, MD for chest pain, shortness of breath  Subjective:   Sabrina Levy, female    DOB: [birth year], 73 y.o.   MRN: [id number]   Chief Complaint  Patient presents with   Shortness of Breath     HPI  73 year old Caucasian female with hyperlipidemia, history of basal cell skin cancer, referred for evaluation of shortness of breath.  She has not had any recurrent episodes of shortness of breath, presyncope/syncope. Echocardiogram, stress test discussed with the patient.   She had a very bad experience with pharmacological stress test, with symptoms of dizziness, nausea, abdominal pain. She wishes never to have pharmacological stress test done again.   She is now on amlodipine 2.5 mg daily for hypertension.  Past Medical History:  Diagnosis Date   Arthritis    osteoarthritis    Cancer ([facility])    hx of basal cell skin cancer - 30 yrs ago per patient   GERD (gastroesophageal reflux disease)    occasional    H/O motion sickness    Pneumonia 2013   aspirate pneumonia due to colonoscopy    Thyroid nodule    small per patient and md just watching    Urinary tract infection    hx of in 20s      Past Surgical History:  Procedure Laterality Date   ABDOMINAL HYSTERECTOMY     CHOLECYSTECTOMY N/A 05/06/2014   Procedure: LAPAROSCOPIC CHOLECYSTECTOMY WITH INTRAOPERATIVE CHOLANGIOGRAM;  Surgeon: Armandina Gemma, MD;  Location: [facility];  Service: General;  Laterality: N/A;   COLON RESECTION  2002    NASAL SINUS SURGERY  2002      Social History   Socioeconomic History   Marital status: Married    Spouse name: Not on file   Number of children: Not on file   Years of education: Not on file   Highest education level: Not on file  Occupational History   Not on file  Social Needs   Financial resource strain: Not on file   Food insecurity    Worry: Not on file    Inability: Not on file   Transportation needs    Medical: Not on  file    Non-medical: Not on file  Tobacco Use   Smoking status: Former Smoker   Smokeless tobacco: Never Used  Substance and Sexual Activity   Alcohol use: No   Drug use: No   Sexual activity: Not on file  Lifestyle   Physical activity    Days per week: Not on file    Minutes per session: Not on file   Stress: Not on file  Relationships   Social connections    Talks on phone: Not on file    Gets together: Not on file    Attends religious service: Not on file    Active member of club or organization: Not on file    Attends meetings of clubs or organizations: Not on file    Relationship status: Not on file   Intimate partner violence    Fear of current or ex partner: Not on file    Emotionally abused: Not on file    Physically abused: Not on file    Forced sexual activity: Not on file  Other Topics Concern   Not on file  Social History Narrative   Not on file     Family History  Problem Relation Age of Onset   Heart attack Father 31   Heart disease Brother 63  Valve replacement     Current Outpatient Medications on File Prior to Visit  Medication Sig Dispense Refill   acetaminophen (TYLENOL) 500 MG tablet Take 500 mg by mouth every 6 (six) hours as needed for mild pain.     atorvastatin (LIPITOR) 20 MG tablet      clotrimazole-betamethasone (LOTRISONE) cream clotrimazole-betamethasone 1 %-0.05 % topical cream  APPLY TO AFFECTED AND SURROUNDING SKIN 2 TIMES A DAY FOR 2 WEEKS     polyethylene glycol (MIRALAX / GLYCOLAX) packet Take 17 g by mouth daily as needed for mild constipation.     valACYclovir (VALTREX) 1000 MG tablet valacyclovir 1 gram tablet     No current facility-administered medications on file prior to visit.     Cardiovascular studies:  Lexiscan Tetrofosmin stress test 02/15/2019: No previous exam available for comparison. Lexiscan nuclear stress test performed using 1-day protocol. Myocardial perfusion imaging is normal.  Left ventricular ejection fraction is 65% with normal wall motion. Low risk study.  Echocardiogram 01/20/2019: Left ventricle cavity is normal in size. Normal left ventricular wall thickness. Normal LV systolic function with EF 55%. Normal global wall motion. Doppler evidence of grade I (impaired) diastolic dysfunction, normal LAP.  Left atrial cavity is normal in size. Aneurysmal interatrial septum without 2D or color Doppler evidence of interatrial shunt. Mild tricuspid regurgitation. trace pulmonic regurgitation.   Outside EKG 01/07/2019: Sinus rhythm 67 bpm. Normal EKG   Recent labs: 10/05/2018: Glucose 97.  BUN/creatinine 15/0.8.  GFR 69.  Sodium 139, potassium 4.7.  Rest of the CMP normal. H/H 14/?.  MCV 92.  Platelets 357. TSH normal. Hemoglobin A1c 5.4% Cholesterol 199, triglycerides 72, HDL 64, LDL 121.   Review of Systems  Constitution: Negative for decreased appetite, malaise/fatigue, weight gain and weight loss.  HENT: Negative for congestion.   Eyes: Negative for visual disturbance.  Cardiovascular: Negative for chest pain, dyspnea on exertion, leg swelling, palpitations and syncope.       Episode of shortness of breath, diaphoresis-as per HPI  Respiratory: Negative for cough.   Endocrine: Negative for cold intolerance.  Hematologic/Lymphatic: Does not bruise/bleed easily.  Skin: Negative for itching and rash.  Musculoskeletal: Negative for myalgias.  Gastrointestinal: Negative for abdominal pain, nausea and vomiting.  Genitourinary: Negative for dysuria.  Neurological: Negative for dizziness and weakness.  Psychiatric/Behavioral: The patient is not nervous/anxious.   All other systems reviewed and are negative.        Vitals:   02/24/19 1440 02/24/19 1442  BP: (!) 150/81 131/79  Pulse: 64 81     Body mass index is 25.47 kg/m. Filed Weights   02/24/19 1440  Weight: 137 lb (62.1 kg)     Objective:   Physical Exam  Constitutional: She is oriented  to person, place, and time. She appears well-developed and well-nourished. No distress.  HENT:  Head: Normocephalic and atraumatic.  Eyes: Pupils are equal, round, and reactive to light. Conjunctivae are normal.  Neck: No JVD present.  Cardiovascular: Normal rate, regular rhythm and intact distal pulses.  Murmur heard.  Systolic murmur is present with a grade of 1/6 at the lower right sternal border. Pulmonary/Chest: Effort normal and breath sounds normal. She has no wheezes. She has no rales.  Abdominal: Soft. Bowel sounds are normal. There is no rebound.  Musculoskeletal:        General: No edema.  Lymphadenopathy:    She has no cervical adenopathy.  Neurological: She is alert and oriented to person, place, and time. No cranial nerve  deficit.  Skin: Skin is warm and dry.  Psychiatric: She has a normal mood and affect.  Nursing note and vitals reviewed.         Assessment & Recommendations:   73 year old Caucasian female with hyperlipidemia, history of basal cell skin cancer, referred for evaluation of shortness of breath.  Shortness of breath, diaphoresis: No recurrent symptoms. No ischemia/infarction on stress testing. Structurally normal heart on echocardiogram.   Hyperlipidemia: Continue Lipitor 10 mg daily for now.   Hypertension: Currently on amlodipine 2.5 mg daily.  Diet & Lifestyle recommendations:  Physical activity recommendation (The Physical Activity Guidelines for Americans. JAMA 2018;Nov 12) At least 150-300 minutes a week of moderate-intensity, or 75-150 minutes a week of vigorous-intensity aerobic physical activity, or an equivalent combination of moderate- and vigorous-intensity aerobic activity. Adults should perform muscle-strengthening activities on 2 or more days a week. Older adults should do multicomponent physical activity that includes balance training as well as aerobic and muscle-strengthening activities. Benefits of increased physical activity  include lower risk of mortality including cardiovascular mortality, lower risk of cardiovascular events and associated risk factors (hypertension and diabetes), and lower risk of many cancers (including bladder, breast, colon, endometrium, esophagus, kidney, lung, and stomach). Additional improvments have been seen in cognition, risk of dementia, anxiety and depression, improved bone health, lower risk of falls, and associated injuries.  Dietary recommendation The 2019 ACC/AHA guidelines promote nutrition as a main fixture of cardiovascular wellness, with a recommendation for a varied diet of fruit, vegetables, fish, legumes, and whole grains (Class I), as well as recommendations to reduce sodium, cholesterol, processed meats, and refined sugars (Class IIa recommendation).10 Sodium intake, a topic of some controversy as of late, is recommended to be kept at 1,500 mg/day or less, far below the average daily intake in the [place] of 3,409 mg/day, and notably below that of previous US recommendations for 300mg /day.10,11 For those unable to reach 1,500 mg/day, they recommend at least a reduction of 1000 mg/day.  A Pesco-Mediterranean Diet With Intermittent Fasting: JACC Review Topic of the Week. J Am Coll Cardiol [id number] Pesco-Mediterranean diet, it is supplemented with extra-virgin olive oil (EVOO), which is the principle fat source, along with moderate amounts of dairy (particularly yogurt and cheese) and eggs, as well as modest amounts of alcohol consumption (ideally red wine with the evening meal), but few red and processed meats.  I will see he ron as needed basis.   Nigel Mormon, MD [facility] [place] Pager: [phone] Office: [phone] If no answer Cell [phone]

## 2019-02-25 DIAGNOSIS — I10 Essential (primary) hypertension: Secondary | ICD-10-CM | POA: Insufficient documentation

## 2019-03-08 DIAGNOSIS — R1032 Left lower quadrant pain: Secondary | ICD-10-CM | POA: Diagnosis not present

## 2019-03-08 DIAGNOSIS — K5793 Diverticulitis of intestine, part unspecified, without perforation or abscess with bleeding: Secondary | ICD-10-CM | POA: Diagnosis not present

## 2019-03-18 ENCOUNTER — Other Ambulatory Visit (HOSPITAL_COMMUNITY): Payer: Self-pay | Admitting: Gastroenterology

## 2019-03-18 ENCOUNTER — Other Ambulatory Visit: Payer: Self-pay | Admitting: Gastroenterology

## 2019-03-18 DIAGNOSIS — R1032 Left lower quadrant pain: Secondary | ICD-10-CM

## 2019-03-30 ENCOUNTER — Other Ambulatory Visit: Payer: Self-pay | Admitting: Gastroenterology

## 2019-04-01 ENCOUNTER — Ambulatory Visit (HOSPITAL_COMMUNITY)
Admission: RE | Admit: 2019-04-01 | Discharge: 2019-04-01 | Disposition: A | Discharge status: Home / Self Care | Payer: Medicare HMO | Source: Ambulatory Visit | Attending: Gastroenterology | Admitting: Gastroenterology

## 2019-04-01 ENCOUNTER — Other Ambulatory Visit: Payer: Self-pay

## 2019-04-01 DIAGNOSIS — R1032 Left lower quadrant pain: Secondary | ICD-10-CM

## 2019-04-01 DIAGNOSIS — R109 Unspecified abdominal pain: Secondary | ICD-10-CM | POA: Diagnosis not present

## 2019-04-01 LAB — POCT I-STAT CREATININE: Creatinine, Ser: 0.7 mg/dL (ref 0.44–1.00)

## 2019-04-01 MED ORDER — SODIUM CHLORIDE (PF) 0.9 % IJ SOLN
INTRAMUSCULAR | Status: AC
  Filled 2019-04-01: qty 50

## 2019-04-01 MED ORDER — IOHEXOL 300 MG/ML  SOLN
100.0000 mL | Freq: Once | INTRAMUSCULAR | Status: AC | PRN
  Administered 2019-04-01: 11:00:00 100 mL via INTRAVENOUS

## 2019-04-07 DIAGNOSIS — K5793 Diverticulitis of intestine, part unspecified, without perforation or abscess with bleeding: Secondary | ICD-10-CM | POA: Diagnosis not present

## 2019-04-07 DIAGNOSIS — R933 Abnormal findings on diagnostic imaging of other parts of digestive tract: Secondary | ICD-10-CM | POA: Diagnosis not present

## 2019-05-13 DIAGNOSIS — K635 Polyp of colon: Secondary | ICD-10-CM | POA: Diagnosis not present

## 2019-05-13 DIAGNOSIS — D124 Benign neoplasm of descending colon: Secondary | ICD-10-CM | POA: Diagnosis not present

## 2019-05-13 DIAGNOSIS — K6389 Other specified diseases of intestine: Secondary | ICD-10-CM | POA: Diagnosis not present

## 2019-05-13 DIAGNOSIS — K5730 Diverticulosis of large intestine without perforation or abscess without bleeding: Secondary | ICD-10-CM | POA: Diagnosis not present

## 2019-05-13 DIAGNOSIS — Z86010 Personal history of colonic polyps: Secondary | ICD-10-CM | POA: Diagnosis not present

## 2019-05-19 DIAGNOSIS — R69 Illness, unspecified: Secondary | ICD-10-CM | POA: Diagnosis not present

## 2019-06-07 DIAGNOSIS — R142 Eructation: Secondary | ICD-10-CM | POA: Diagnosis not present

## 2019-06-07 DIAGNOSIS — M549 Dorsalgia, unspecified: Secondary | ICD-10-CM | POA: Diagnosis not present

## 2019-06-14 ENCOUNTER — Other Ambulatory Visit: Payer: Self-pay

## 2019-06-14 ENCOUNTER — Emergency Department (HOSPITAL_COMMUNITY)
Admission: EM | Admit: 2019-06-14 | Discharge: 2019-06-14 | Disposition: A | Discharge status: Home / Self Care | Payer: Medicare HMO | Attending: Emergency Medicine | Admitting: Emergency Medicine

## 2019-06-14 ENCOUNTER — Emergency Department (HOSPITAL_COMMUNITY): Payer: Medicare HMO

## 2019-06-14 ENCOUNTER — Encounter (HOSPITAL_COMMUNITY): Payer: Self-pay

## 2019-06-14 DIAGNOSIS — R143 Flatulence: Secondary | ICD-10-CM | POA: Diagnosis not present

## 2019-06-14 DIAGNOSIS — Z743 Need for continuous supervision: Secondary | ICD-10-CM | POA: Diagnosis not present

## 2019-06-14 DIAGNOSIS — R1032 Left lower quadrant pain: Secondary | ICD-10-CM | POA: Diagnosis not present

## 2019-06-14 DIAGNOSIS — R079 Chest pain, unspecified: Secondary | ICD-10-CM | POA: Diagnosis not present

## 2019-06-14 DIAGNOSIS — Z87891 Personal history of nicotine dependence: Secondary | ICD-10-CM | POA: Insufficient documentation

## 2019-06-14 DIAGNOSIS — R1084 Generalized abdominal pain: Secondary | ICD-10-CM | POA: Diagnosis not present

## 2019-06-14 DIAGNOSIS — Z85828 Personal history of other malignant neoplasm of skin: Secondary | ICD-10-CM | POA: Insufficient documentation

## 2019-06-14 DIAGNOSIS — I10 Essential (primary) hypertension: Secondary | ICD-10-CM | POA: Diagnosis not present

## 2019-06-14 DIAGNOSIS — R109 Unspecified abdominal pain: Secondary | ICD-10-CM | POA: Diagnosis not present

## 2019-06-14 DIAGNOSIS — Z79899 Other long term (current) drug therapy: Secondary | ICD-10-CM | POA: Diagnosis not present

## 2019-06-14 DIAGNOSIS — R279 Unspecified lack of coordination: Secondary | ICD-10-CM | POA: Diagnosis not present

## 2019-06-14 DIAGNOSIS — R142 Eructation: Secondary | ICD-10-CM | POA: Diagnosis not present

## 2019-06-14 DIAGNOSIS — R319 Hematuria, unspecified: Secondary | ICD-10-CM | POA: Insufficient documentation

## 2019-06-14 DIAGNOSIS — M545 Low back pain, unspecified: Secondary | ICD-10-CM

## 2019-06-14 LAB — COMPREHENSIVE METABOLIC PANEL
ALT: 21 U/L (ref 0–44)
AST: 25 U/L (ref 15–41)
Albumin: 4.2 g/dL (ref 3.5–5.0)
Alkaline Phosphatase: 82 U/L (ref 38–126)
Anion gap: 10 (ref 5–15)
BUN: 14 mg/dL (ref 8–23)
CO2: 22 mmol/L (ref 22–32)
Calcium: 9.3 mg/dL (ref 8.9–10.3)
Chloride: 107 mmol/L (ref 98–111)
Creatinine, Ser: 0.74 mg/dL (ref 0.44–1.00)
GFR calc Af Amer: >60 mL/min (ref >60)
GFR calc non Af Amer: >60 mL/min (ref >60)
Glucose, Bld: 116 mg/dL — ABNORMAL HIGH (ref 70–99)
Potassium: 3.9 mmol/L (ref 3.5–5.1)
Sodium: 139 mmol/L (ref 135–145)
Total Bilirubin: 0.8 mg/dL (ref 0.3–1.2)
Total Protein: 7.3 g/dL (ref 6.5–8.1)

## 2019-06-14 LAB — CBC WITH DIFFERENTIAL/PLATELET
Abs Immature Granulocytes: 0.02 10*3/uL (ref 0.00–0.07)
Basophils Absolute: 0 10*3/uL (ref 0.0–0.1)
Basophils Relative: 0 %
Eosinophils Absolute: 0.1 10*3/uL (ref 0.0–0.5)
Eosinophils Relative: 1 %
HCT: 43.8 % (ref 36.0–46.0)
Hemoglobin: 14.3 g/dL (ref 12.0–15.0)
Immature Granulocytes: 0 %
Lymphocytes Relative: 33 %
Lymphs Abs: 2.8 10*3/uL (ref 0.7–4.0)
MCH: 30 pg (ref 26.0–34.0)
MCHC: 32.6 g/dL (ref 30.0–36.0)
MCV: 92 fL (ref 80.0–100.0)
Monocytes Absolute: 0.7 10*3/uL (ref 0.1–1.0)
Monocytes Relative: 9 %
Neutro Abs: 4.7 10*3/uL (ref 1.7–7.7)
Neutrophils Relative %: 57 %
Platelets: 353 10*3/uL (ref 150–400)
RBC: 4.76 MIL/uL (ref 3.87–5.11)
RDW: 13.7 % (ref 11.5–15.5)
WBC: 8.4 10*3/uL (ref 4.0–10.5)
nRBC: 0 % (ref 0.0–0.2)

## 2019-06-14 LAB — URINALYSIS, ROUTINE W REFLEX MICROSCOPIC
Bilirubin Urine: NEGATIVE
Glucose, UA: NEGATIVE mg/dL
Ketones, ur: NEGATIVE mg/dL
Nitrite: NEGATIVE
Protein, ur: NEGATIVE mg/dL
Specific Gravity, Urine: 1.016 (ref 1.005–1.030)
pH: 5 (ref 5.0–8.0)

## 2019-06-14 LAB — LACTIC ACID, PLASMA
Lactic Acid, Venous: 1.3 mmol/L (ref 0.5–1.9)
Lactic Acid, Venous: 1 mmol/L (ref 0.5–1.9)

## 2019-06-14 LAB — TROPONIN I (HIGH SENSITIVITY)
Troponin I (High Sensitivity): 3 ng/L (ref <18)
Troponin I (High Sensitivity): 4 ng/L (ref <18)

## 2019-06-14 LAB — D-DIMER, QUANTITATIVE (NOT AT ARMC): D-Dimer, Quant: 1.17 ug/mL-FEU — ABNORMAL HIGH (ref 0.00–0.50)

## 2019-06-14 LAB — LIPASE, BLOOD: Lipase: 26 U/L (ref 11–51)

## 2019-06-14 MED ORDER — LIDOCAINE 5 % EX PTCH
1.0000 | MEDICATED_PATCH | CUTANEOUS | Status: DC
  Administered 2019-06-14: 1 via TRANSDERMAL
  Filled 2019-06-14: qty 1

## 2019-06-14 MED ORDER — LIDOCAINE 5 % EX PTCH: 1.0000 | MEDICATED_PATCH | CUTANEOUS | 0 refills | Status: DC

## 2019-06-14 MED ORDER — SODIUM CHLORIDE (PF) 0.9 % IJ SOLN
INTRAMUSCULAR | Status: AC
  Filled 2019-06-14: qty 50

## 2019-06-14 MED ORDER — HYDROCODONE-ACETAMINOPHEN 5-325 MG PO TABS: 1.0000 | ORAL_TABLET | ORAL | 0 refills | Status: DC | PRN

## 2019-06-14 MED ORDER — MORPHINE SULFATE (PF) 2 MG/ML IV SOLN
2.0000 mg | Freq: Once | INTRAVENOUS | Status: AC
  Administered 2019-06-14: 05:00:00 2 mg via INTRAVENOUS
  Filled 2019-06-14: qty 1

## 2019-06-14 MED ORDER — MORPHINE SULFATE (PF) 2 MG/ML IV SOLN
2.0000 mg | Freq: Once | INTRAVENOUS | Status: AC
  Administered 2019-06-14: 07:00:00 2 mg via INTRAVENOUS
  Filled 2019-06-14: qty 1

## 2019-06-14 MED ORDER — IOHEXOL 350 MG/ML SOLN
100.0000 mL | Freq: Once | INTRAVENOUS | Status: AC | PRN
  Administered 2019-06-14: 100 mL via INTRAVENOUS

## 2019-06-14 NOTE — Discharge Instructions (Signed)
Your testing shows no heart attack, blood clot in the lung, aneurysm.  There is no evidence of urinary tract infection.  You may have passed a kidney stone.  Take the pain medication as prescribed and follow-up with your doctor.  Return to the ED with worsening pain, weakness, numbness, tingling, bowel bladder incontinence, fever, vomiting or other concerns.

## 2019-06-14 NOTE — ED Notes (Signed)
Pt c/o R flank pain and lower abdominal pain since November.  Sts pain has increased x 2 weeks.  Pain score 7/10.  Denies v/d.  Sts frequent constipation and takes Linzess.  Hx of diverticulitis.

## 2019-06-14 NOTE — ED Triage Notes (Signed)
Arrived by [facility] from home patient reports left lower abdominal pain (stabbing tearing pain) and lower back pain X2 weeks. Patient states she was diagnosed with diverticulitis but does not think that is correct.

## 2019-06-14 NOTE — ED Provider Notes (Signed)
[facility] DEPT Provider Note   CSN: [id number] Arrival date & time: 06/14/19  0300     History Chief Complaint  Patient presents with  . Abdominal Pain  . Back Pain    lower back pain    Sabrina Levy is a 74 y.o. female.  Patient with history of acid reflux disease, diverticulitis presenting from home with a 10-day history of right lower back and flank pain.  She reports she saw her doctor on March 22 who told her to take ibuprofen for this pain.  It did help some but the is becoming more severe.  She states she had this pain for about 10 days prior to March 22 when it first started.  She was moving some boxes but does not remember specific injury.  There is no focal weakness, numbness or tingling.  No bowel or bladder incontinence.  No fever or vomiting.  No pain with urination or blood in the urine.  Has not noticed any rash. She has been taking ibuprofen as well as Mylanta at home for this pain without relief.  She reports her GI doctor told her this may be musculoskeletal and told her to take ibuprofen.  She does have a history of diverticulitis and has had intermittent left-sided abdominal pain as well but none currently.  She denies any chest pain.  She has had a lot of belching and gas production but denies any chest pain or shortness of breath. She called EMS today because the pain was severe and she could not get comfortable.  She is supposed to call her GI doctor tomorrow.  Patient states previous cholecystectomy, appendectomy and hysterectomy.  The history is provided by the patient.  Abdominal Pain Associated symptoms: no chest pain, no cough, no dysuria, no fever, no hematuria, no nausea, no shortness of breath and no vomiting   Back Pain Associated symptoms: abdominal pain   Associated symptoms: no chest pain, no dysuria, no fever, no headaches and no weakness        Past Medical History:  Diagnosis Date  . Arthritis    osteoarthritis   . Cancer ([place])    hx of basal cell skin cancer - 30 yrs ago per patient  . GERD (gastroesophageal reflux disease)    occasional   . H/O motion sickness   . Pneumonia 2013   aspirate pneumonia due to colonoscopy   . Thyroid nodule    small per patient and md just watching   . Urinary tract infection    hx of in 20s     Patient Active Problem List   Diagnosis Date Noted  . Essential hypertension 02/25/2019  . Shortness of breath 01/14/2019  . Chest pain 01/14/2019  . Mixed hyperlipidemia 01/14/2019  . Blood in urine 12/03/2017  . Menopausal symptom 12/03/2017  . Biliary dyskinesia 05/06/2014    Past Surgical History:  Procedure Laterality Date  . ABDOMINAL HYSTERECTOMY    . CHOLECYSTECTOMY N/A 05/06/2014   Procedure: LAPAROSCOPIC CHOLECYSTECTOMY WITH INTRAOPERATIVE CHOLANGIOGRAM;  Surgeon: Armandina Gemma, MD;  Location: [facility];  Service: General;  Laterality: N/A;  . COLON RESECTION  2002   . NASAL SINUS SURGERY  2002      OB History   No obstetric history on file.     Family History  Problem Relation Age of Onset  . Heart attack Father 28  . Heart disease Brother 43       Valve replacement    Social History  Tobacco Use  . Smoking status: Former Research scientist (life sciences)  . Smokeless tobacco: Never Used  Substance Use Topics  . Alcohol use: No  . Drug use: No    Home Medications Prior to Admission medications   Medication Sig Start Date End Date Taking? Authorizing Provider  acetaminophen (TYLENOL) 500 MG tablet Take 500 mg by mouth every 6 (six) hours as needed for mild pain.    [provider]  amLODipine (NORVASC) 2.5 MG tablet Take 2.5 mg by mouth daily. 02/18/19   [provider]  atorvastatin (LIPITOR) 20 MG tablet 10 mg daily.  09/08/18   [provider]  clotrimazole-betamethasone (LOTRISONE) cream clotrimazole-betamethasone 1 %-0.05 % topical cream  APPLY TO AFFECTED AND SURROUNDING SKIN 2 TIMES A DAY FOR 2 WEEKS    [provider]  polyethylene glycol (MIRALAX / GLYCOLAX) packet Take 17 g by mouth daily as needed for mild constipation.    [provider]  valACYclovir (VALTREX) 1000 MG tablet as needed.     [provider]    Allergies    Aspirin, Monosodium glutamate, Nsaids, Sulfa antibiotics, Sulfacetamide sodium, Sulfamethoxazole, and Tolmetin  Review of Systems   Review of Systems  Constitutional: Negative for activity change, appetite change and fever.  HENT: Negative for congestion and rhinorrhea.   Eyes: Negative for visual disturbance.  Respiratory: Negative for cough and shortness of breath.   Cardiovascular: Negative for chest pain.  Gastrointestinal: Positive for abdominal pain. Negative for nausea and vomiting.  Genitourinary: Negative for dysuria and hematuria.  Musculoskeletal: Positive for back pain.  Skin: Negative for rash.  Neurological: Negative for dizziness, weakness and headaches.   all other systems are negative except as noted in the HPI and PMH.    Physical Exam Updated Vital Signs BP 139/80 (BP Location: Left Arm)   Pulse 68   Temp 98.3 F (36.8 C) (Oral)   Resp 20   Ht 5\' 1"  (1.549 m)   Wt 66.7 kg   SpO2 97%   BMI 27.78 kg/m   Physical Exam Vitals and nursing note reviewed.  Constitutional:      General: She is not in acute distress.    Appearance: She is well-developed.     Comments: Anxious appearing  HENT:     Head: Normocephalic and atraumatic.     Mouth/Throat:     Pharynx: No oropharyngeal exudate.  Eyes:     Conjunctiva/sclera: Conjunctivae normal.     Pupils: Pupils are equal, round, and reactive to light.  Neck:     Comments: No meningismus. Cardiovascular:     Rate and Rhythm: Normal rate and regular rhythm.     Heart sounds: Normal heart sounds. No murmur.  Pulmonary:     Effort: Pulmonary effort is normal. No respiratory distress.     Breath sounds: Normal breath sounds.  Abdominal:     Palpations: Abdomen is  soft.     Tenderness: There is no abdominal tenderness. There is no guarding or rebound.     Comments: Minimal left lower quadrant tenderness, no guarding or rebound.  Equal femoral, DP and PT pulse  Musculoskeletal:        General: Tenderness present. Normal range of motion.     Cervical back: Normal range of motion and neck supple.     Comments: Right paraspinal lumbar tenderness, no rash  Skin:    General: Skin is warm.     Capillary Refill: Capillary refill takes less than 2 seconds.  Neurological:  General: No focal deficit present.     Mental Status: She is alert and oriented to person, place, and time. Mental status is at baseline.     Cranial Nerves: No cranial nerve deficit.     Motor: No abnormal muscle tone.     Coordination: Coordination normal.     Comments:  5/5 strength throughout. CN 2-12 intact.Equal grip strength.   Psychiatric:        Behavior: Behavior normal.     ED Results / Procedures / Treatments   Labs (all labs ordered are listed, but only abnormal results are displayed) Labs Reviewed  COMPREHENSIVE METABOLIC PANEL - Abnormal; Notable for the following components:      Result Value   Glucose, Bld 116 (*)    All other components within normal limits  URINALYSIS, ROUTINE W REFLEX MICROSCOPIC - Abnormal; Notable for the following components:   Hgb urine dipstick SMALL (*)    Leukocytes,Ua SMALL (*)    Bacteria, UA RARE (*)    All other components within normal limits  D-DIMER, QUANTITATIVE (NOT AT [facility]) - Abnormal; Notable for the following components:   D-Dimer, Quant 1.17 (*)    All other components within normal limits  CBC WITH DIFFERENTIAL/PLATELET  LIPASE, BLOOD  LACTIC ACID, PLASMA  LACTIC ACID, PLASMA  TROPONIN I (HIGH SENSITIVITY)  TROPONIN I (HIGH SENSITIVITY)    EKG EKG Interpretation  Date/Time:  Monday June 14 2019 04:01:39 EDT Ventricular Rate:  64 PR Interval:    QRS Duration: 88 QT Interval:  424 QTC  Calculation: 438 R Axis:   64 Text Interpretation: Sinus rhythm No significant change was found Confirmed by Ezequiel Essex [phone]) on 06/14/2019 4:07:32 AM   Radiology CT Angio Chest/Abd/Pel for Dissection W and/or Wo Contrast  Result Date: 06/14/2019 CLINICAL DATA:  Chest and abdominal pain. EXAM: CT ANGIOGRAPHY CHEST, ABDOMEN AND PELVIS TECHNIQUE: Multidetector CT imaging through the chest, abdomen and pelvis was performed using the standard protocol during bolus administration of intravenous contrast. Multiplanar reconstructed images and MIPs were obtained and reviewed to evaluate the vascular anatomy. CONTRAST:  118mL OMNIPAQUE IOHEXOL 350 MG/ML SOLN COMPARISON:  CT abdomen 04/01/2019 FINDINGS: CTA CHEST FINDINGS Cardiovascular: The heart is normal in size. No pericardial effusion. The aorta is normal in caliber. No dissection. Atherosclerotic calcifications along the aortic arch. Minimal scattered coronary artery calcifications. The aortic branch vessels are patent. The pulmonary arterial tree is fairly well opacified. No filling defects to suggest pulmonary embolism. Mediastinum/Nodes: No mediastinal or hilar mass or adenopathy. The esophagus is grossly normal. Lungs/Pleura: No acute pulmonary findings. No worrisome pulmonary lesions. Musculoskeletal: No breast masses, supraclavicular axillary adenopathy. The thyroid gland is grossly normal. The bony thorax is intact. Review of the MIP images confirms the above findings. CTA ABDOMEN AND PELVIS FINDINGS VASCULAR Aorta: Normal caliber. Scattered distal aortic calcifications. No dissection. Celiac: Minimal ostial calcifications. No stenosis or aneurysm. SMA: Proximal calcifications but no stenosis or dissection. Renals: Ostial calcifications on the right side but no significant stenosis. No aneurysm. IMA: Patent. Inflow: Mild atherosclerotic calcifications and mild fusiform enlargement of the right common iliac artery. No dissection. Veins: Grossly  normal. Review of the MIP images confirms the above findings. NON-VASCULAR Hepatobiliary: No focal hepatic lesions are identified. There is some reflux of contrast material down the IVC and into the hepatic veins which could suggest tricuspid regurgitation. The gallbladder is surgically absent. Mild associated common bile duct dilatation. Pancreas: No mass, inflammation or ductal dilatation. Spleen: Normal size. No focal lesions.  Adrenals/Urinary Tract: The adrenal glands are unremarkable. No renal, ureteral or bladder calculi or mass. There is a simple upper pole left renal cyst noted. Stomach/Bowel: The stomach, duodenum, small bowel and colon are unremarkable. No acute inflammatory changes, mass lesions or obstructive findings. The terminal ileum is normal. The appendix is not identified and may be surgically absent. Scattered colonic diverticulosis without findings for acute diverticulitis. Lymphatic: No mesenteric or retroperitoneal mass or adenopathy. Reproductive: Surgically absent. Other: No pelvic mass or adenopathy. No free pelvic fluid collections. No inguinal mass or adenopathy. No abdominal wall hernia or subcutaneous lesions. Musculoskeletal: No significant bony findings. Review of the MIP images confirms the above findings. IMPRESSION: 1. No CT findings for aortic dissection or aneurysm. 2. No CT findings for pulmonary embolism. 3. No acute pulmonary findings. 4. No acute abdominal/pelvic findings, mass lesions or adenopathy. 5. Moderate distal aortic and proximal iliac artery atherosclerotic disease. The aortic branch vessels are patent. 6. Status post cholecystectomy with mild associated common bile duct dilatation. Aortic Atherosclerosis (ICD10-I70.0). Electronically Signed   By: Marijo Sanes M.D.   On: 06/14/2019 06:44    Procedures Procedures (including critical care time)  Medications Ordered in ED Medications - No data to display  ED Course  I have reviewed the triage vital signs and  the nursing notes.  Pertinent labs & imaging results that were available during my care of the patient were reviewed by me and considered in my medical decision making (see chart for details).    MDM Rules/Calculators/A&P                     Several weeks of right lower back pain is progressively worsening.  Denies any dysuria or hematuria.  No fever or vomiting.  Work-up is reassuring.  Labs show hematuria without evidence of infection.  Patient did have pain improvement with morphine. She is hesitant to try any additional pain medications.  Her back pain appears to be musculoskeletal in origin and is worse with palpation and movement.  Also consider early zoster without rash visible.  She has no focal weakness, numbness, tingling.  No bowel or bladder incontinence.  Low suspicion for cord compression or cauda equina.  EKG is sinus rhythm.  Troponin negative x2.  D-dimer negative.  Low suspicion for ACS or PE.  Consider possibly passed kidney stone.  She has PCP follow-up as well as GI follow-up this week.  No evidence of abdominal aortic aneurysm or kidney stone.  We will treat supportively with topical Lidoderm patches as well as anti-inflammatories and pain medication.  Follow-up with your PCP.  Return precautions discussed. Final Clinical Impression(s) / ED Diagnoses Final diagnoses:  Acute right-sided low back pain without sciatica    Rx / DC Orders ED Discharge Orders    None       Ann Groeneveld, Annie Main, MD 06/14/19 [phone]

## 2019-06-14 NOTE — ED Notes (Signed)
Pt taken to CT.

## 2019-06-15 DIAGNOSIS — K30 Functional dyspepsia: Secondary | ICD-10-CM | POA: Diagnosis not present

## 2019-06-15 DIAGNOSIS — M545 Low back pain: Secondary | ICD-10-CM | POA: Diagnosis not present

## 2019-06-15 DIAGNOSIS — Z8249 Family history of ischemic heart disease and other diseases of the circulatory system: Secondary | ICD-10-CM | POA: Diagnosis not present

## 2019-06-15 DIAGNOSIS — K5790 Diverticulosis of intestine, part unspecified, without perforation or abscess without bleeding: Secondary | ICD-10-CM | POA: Diagnosis not present

## 2019-06-16 DIAGNOSIS — Z23 Encounter for immunization: Secondary | ICD-10-CM | POA: Diagnosis not present

## 2019-07-14 DIAGNOSIS — Z23 Encounter for immunization: Secondary | ICD-10-CM | POA: Diagnosis not present

## 2019-07-20 DIAGNOSIS — R69 Illness, unspecified: Secondary | ICD-10-CM | POA: Diagnosis not present

## 2019-08-10 DIAGNOSIS — R69 Illness, unspecified: Secondary | ICD-10-CM | POA: Diagnosis not present

## 2019-09-27 DIAGNOSIS — D2361 Other benign neoplasm of skin of right upper limb, including shoulder: Secondary | ICD-10-CM | POA: Diagnosis not present

## 2019-09-27 DIAGNOSIS — D1801 Hemangioma of skin and subcutaneous tissue: Secondary | ICD-10-CM | POA: Diagnosis not present

## 2019-09-27 DIAGNOSIS — L570 Actinic keratosis: Secondary | ICD-10-CM | POA: Diagnosis not present

## 2019-09-27 DIAGNOSIS — L821 Other seborrheic keratosis: Secondary | ICD-10-CM | POA: Diagnosis not present

## 2019-09-27 DIAGNOSIS — L918 Other hypertrophic disorders of the skin: Secondary | ICD-10-CM | POA: Diagnosis not present

## 2019-09-27 DIAGNOSIS — Z85828 Personal history of other malignant neoplasm of skin: Secondary | ICD-10-CM | POA: Diagnosis not present

## 2019-09-27 DIAGNOSIS — L814 Other melanin hyperpigmentation: Secondary | ICD-10-CM | POA: Diagnosis not present

## 2019-10-07 DIAGNOSIS — E7849 Other hyperlipidemia: Secondary | ICD-10-CM | POA: Diagnosis not present

## 2019-10-07 DIAGNOSIS — Z0000 Encounter for general adult medical examination without abnormal findings: Secondary | ICD-10-CM | POA: Diagnosis not present

## 2019-10-07 DIAGNOSIS — R739 Hyperglycemia, unspecified: Secondary | ICD-10-CM | POA: Diagnosis not present

## 2019-10-07 DIAGNOSIS — E041 Nontoxic single thyroid nodule: Secondary | ICD-10-CM | POA: Diagnosis not present

## 2019-10-13 DIAGNOSIS — R69 Illness, unspecified: Secondary | ICD-10-CM | POA: Diagnosis not present

## 2019-10-14 DIAGNOSIS — K5900 Constipation, unspecified: Secondary | ICD-10-CM | POA: Diagnosis not present

## 2019-10-14 DIAGNOSIS — K589 Irritable bowel syndrome without diarrhea: Secondary | ICD-10-CM | POA: Diagnosis not present

## 2019-10-14 DIAGNOSIS — E663 Overweight: Secondary | ICD-10-CM | POA: Diagnosis not present

## 2019-10-14 DIAGNOSIS — M1990 Unspecified osteoarthritis, unspecified site: Secondary | ICD-10-CM | POA: Diagnosis not present

## 2019-10-14 DIAGNOSIS — I700 Atherosclerosis of aorta: Secondary | ICD-10-CM | POA: Diagnosis not present

## 2019-10-14 DIAGNOSIS — R82998 Other abnormal findings in urine: Secondary | ICD-10-CM | POA: Diagnosis not present

## 2019-10-14 DIAGNOSIS — Z0000 Encounter for general adult medical examination without abnormal findings: Secondary | ICD-10-CM | POA: Diagnosis not present

## 2019-10-14 DIAGNOSIS — E785 Hyperlipidemia, unspecified: Secondary | ICD-10-CM | POA: Diagnosis not present

## 2019-10-14 DIAGNOSIS — H269 Unspecified cataract: Secondary | ICD-10-CM | POA: Diagnosis not present

## 2019-10-14 DIAGNOSIS — I781 Nevus, non-neoplastic: Secondary | ICD-10-CM | POA: Diagnosis not present

## 2019-10-14 DIAGNOSIS — Z8249 Family history of ischemic heart disease and other diseases of the circulatory system: Secondary | ICD-10-CM | POA: Diagnosis not present

## 2019-10-21 DIAGNOSIS — Z1212 Encounter for screening for malignant neoplasm of rectum: Secondary | ICD-10-CM | POA: Diagnosis not present

## 2019-11-15 DIAGNOSIS — H524 Presbyopia: Secondary | ICD-10-CM | POA: Diagnosis not present

## 2019-11-15 DIAGNOSIS — H25813 Combined forms of age-related cataract, bilateral: Secondary | ICD-10-CM | POA: Diagnosis not present

## 2019-11-15 DIAGNOSIS — H5203 Hypermetropia, bilateral: Secondary | ICD-10-CM | POA: Diagnosis not present

## 2019-11-29 DIAGNOSIS — H524 Presbyopia: Secondary | ICD-10-CM | POA: Diagnosis not present

## 2019-11-29 DIAGNOSIS — H52223 Regular astigmatism, bilateral: Secondary | ICD-10-CM | POA: Diagnosis not present

## 2019-12-25 DIAGNOSIS — Z23 Encounter for immunization: Secondary | ICD-10-CM | POA: Diagnosis not present

## 2020-02-16 DIAGNOSIS — R69 Illness, unspecified: Secondary | ICD-10-CM | POA: Diagnosis not present

## 2020-03-22 ENCOUNTER — Ambulatory Visit (INDEPENDENT_AMBULATORY_CARE_PROVIDER_SITE_OTHER): Payer: Medicare HMO

## 2020-03-22 ENCOUNTER — Ambulatory Visit: Payer: Medicare HMO | Admitting: Physician Assistant

## 2020-03-22 ENCOUNTER — Encounter: Payer: Self-pay | Admitting: Physician Assistant

## 2020-03-22 DIAGNOSIS — M25512 Pain in left shoulder: Secondary | ICD-10-CM | POA: Diagnosis not present

## 2020-03-22 DIAGNOSIS — M542 Cervicalgia: Secondary | ICD-10-CM | POA: Diagnosis not present

## 2020-03-22 MED ORDER — CYCLOBENZAPRINE HCL 5 MG PO TABS: 5.0000 mg | ORAL_TABLET | Freq: Every day | ORAL | 1 refills | Status: DC

## 2020-03-22 NOTE — Progress Notes (Signed)
Office Visit Note   Patient: Sabrina Levy           Date of Birth: 1945-12-29           MRN: [id number] Visit Date: 03/22/2020              Requested by: Creola Corn, MD [address] [place],  [place] [address] PCP: Creola Corn, MD   Assessment & Plan: Visit Diagnoses:  1. Left shoulder pain, unspecified chronicity   2. Cervicalgia     Plan:  She will continue to take her ibuprofen as she is talked with her gastroenterologist about this.  We will send her to physical therapy for her neck to work on range of motion, modalities, and home exercise program.  We will see her back in 6 weeks to see how she is doing overall.  Questions were encouraged and answered  Follow-Up Instructions: Return in about 6 weeks (around 05/03/2020).   Orders:  Orders Placed This Encounter  Procedures   XR Cervical Spine 2 or 3 views   XR Shoulder Left   Meds ordered this encounter  Medications   cyclobenzaprine (FLEXERIL) 5 MG tablet    Sig: Take 1 tablet (5 mg total) by mouth at bedtime.    Dispense:  30 tablet    Refill:  1      Procedures: No procedures performed   Clinical Data: No additional findings.   Subjective: Chief Complaint  Patient presents with   Neck - Pain   Left Shoulder - Pain    HPI Sabrina Levy returns today due to left shoulder pain and neck pain.  She states the pains been ongoing for the past 6 weeks.  No numbness or tingling.  She has throbbing pain down the left arm she notes pain whenever she is sleeping on the left shoulder.  No known injury.  Recently started back to aerobics is wondering if this is the cause of the pain. Review of Systems See HPI otherwise negative noncontributory.  Objective: Vital Signs: There were no vitals taken for this visit.  Physical Exam General well-developed well-nourished female no acute distress mood affect appropriate. Psych: Alert and oriented x3. Ortho Exam Cervical spine: She has tenderness about the  lower cervical spinal column and over the medial border of the left scapula.  Positive Spurling's.  Good range of motion cervical spine.   Upper extremities: 5 out of 5 strengths throughout against resistance.  Full motor full sensation bilateral hands.  Negative impingement testing bilateral shoulders.  No weakness rotator cuff on exam.  Tenderness trapezius region of the left shoulder.  Fluid range of motion of the left shoulder without pain. Specialty Comments:  No specialty comments available.  Imaging: XR Cervical Spine 2 or 3 views  Result Date: 03/22/2020 Cervical spine 2 views: Loss of lordotic curvature.  This space overall well-maintained.  C3-C7 mild endplate spurring anteriorly.  No spondylolisthesis.  XR Shoulder Left  Result Date: 03/22/2020 Left shoulder: 3 views: Shoulders well located no acute fractures.  Glenohumeral joint appears well-maintained.    PMFS History: Patient Active Problem List   Diagnosis Date Noted   Essential hypertension 02/25/2019   Shortness of breath 01/14/2019   Chest pain 01/14/2019   Mixed hyperlipidemia 01/14/2019   Blood in urine 12/03/2017   Menopausal symptom 12/03/2017   Biliary dyskinesia 05/06/2014   Past Medical History:  Diagnosis Date   Arthritis    osteoarthritis    Cancer ([facility])    hx of  basal cell skin cancer - 30 yrs ago per patient   GERD (gastroesophageal reflux disease)    occasional    H/O motion sickness    Pneumonia 2013   aspirate pneumonia due to colonoscopy    Thyroid nodule    small per patient and md just watching    Urinary tract infection    hx of in 20s     Family History  Problem Relation Age of Onset   Heart attack Father 98   Heart disease Brother 19       Valve replacement    Past Surgical History:  Procedure Laterality Date   ABDOMINAL HYSTERECTOMY     CHOLECYSTECTOMY N/A 05/06/2014   Procedure: LAPAROSCOPIC CHOLECYSTECTOMY WITH INTRAOPERATIVE CHOLANGIOGRAM;  Surgeon: Armandina Gemma, MD;  Location: [facility];  Service: General;  Laterality: N/A;   COLON RESECTION  2002    NASAL SINUS SURGERY  2002    Social History   Occupational History   Not on file  Tobacco Use   Smoking status: Former Smoker   Smokeless tobacco: Never Used  Substance and Sexual Activity   Alcohol use: No   Drug use: No   Sexual activity: Not on file

## 2020-03-28 ENCOUNTER — Telehealth: Payer: Self-pay | Admitting: Orthopaedic Surgery

## 2020-03-28 NOTE — Telephone Encounter (Signed)
Pt called asking if we have a print out or any advice of any home exercises she can do for her Neck while she is waiting for PT

## 2020-03-29 NOTE — Telephone Encounter (Signed)
Left exercises on your desk

## 2020-03-29 NOTE — Telephone Encounter (Signed)
Patient aware these are at the front desk for her

## 2020-05-03 ENCOUNTER — Ambulatory Visit: Payer: Medicare HMO | Admitting: Physician Assistant

## 2020-05-05 DIAGNOSIS — Z01419 Encounter for gynecological examination (general) (routine) without abnormal findings: Secondary | ICD-10-CM | POA: Diagnosis not present

## 2020-05-05 DIAGNOSIS — Z1231 Encounter for screening mammogram for malignant neoplasm of breast: Secondary | ICD-10-CM | POA: Diagnosis not present

## 2020-06-28 ENCOUNTER — Other Ambulatory Visit: Payer: Self-pay | Admitting: Podiatry

## 2020-06-28 ENCOUNTER — Ambulatory Visit (INDEPENDENT_AMBULATORY_CARE_PROVIDER_SITE_OTHER): Payer: Medicare HMO | Admitting: Podiatry

## 2020-06-28 ENCOUNTER — Ambulatory Visit (INDEPENDENT_AMBULATORY_CARE_PROVIDER_SITE_OTHER): Payer: Medicare HMO

## 2020-06-28 ENCOUNTER — Other Ambulatory Visit: Payer: Self-pay

## 2020-06-28 DIAGNOSIS — K5900 Constipation, unspecified: Secondary | ICD-10-CM | POA: Insufficient documentation

## 2020-06-28 DIAGNOSIS — Z860100 Personal history of colon polyps, unspecified: Secondary | ICD-10-CM | POA: Insufficient documentation

## 2020-06-28 DIAGNOSIS — R933 Abnormal findings on diagnostic imaging of other parts of digestive tract: Secondary | ICD-10-CM | POA: Insufficient documentation

## 2020-06-28 DIAGNOSIS — M79672 Pain in left foot: Secondary | ICD-10-CM

## 2020-06-28 DIAGNOSIS — M7752 Other enthesopathy of left foot: Secondary | ICD-10-CM

## 2020-06-28 DIAGNOSIS — K5793 Diverticulitis of intestine, part unspecified, without perforation or abscess with bleeding: Secondary | ICD-10-CM | POA: Insufficient documentation

## 2020-06-28 DIAGNOSIS — R1032 Left lower quadrant pain: Secondary | ICD-10-CM | POA: Insufficient documentation

## 2020-06-28 DIAGNOSIS — R142 Eructation: Secondary | ICD-10-CM | POA: Insufficient documentation

## 2020-06-28 DIAGNOSIS — K5730 Diverticulosis of large intestine without perforation or abscess without bleeding: Secondary | ICD-10-CM | POA: Insufficient documentation

## 2020-06-28 NOTE — Progress Notes (Signed)
   HPI: 75 y.o. female presenting today as a new patient for evaluation of left forefoot pain that began approximately 2 weeks ago.  Patient states that she was watching TV when she noticed cramping in her toes.  Is been painful ever since.  She denies a history of injury.  She has not done anything for treatment except for trying to stretch it out.  She presents for further treatment evaluation  Past Medical History:  Diagnosis Date  . Arthritis    osteoarthritis   . Cancer ([facility])    hx of basal cell skin cancer - 30 yrs ago per patient  . GERD (gastroesophageal reflux disease)    occasional   . H/O motion sickness   . Pneumonia 2013   aspirate pneumonia due to colonoscopy   . Thyroid nodule    small per patient and md just watching   . Urinary tract infection    hx of in 20s      Physical Exam: General: The patient is alert and oriented x3 in no acute distress.  Dermatology: Skin is warm, dry and supple bilateral lower extremities. Negative for open lesions or macerations.  Vascular: Palpable pedal pulses bilaterally. No edema or erythema noted. Capillary refill within normal limits.  Neurological: Epicritic and protective threshold grossly intact bilaterally.   Musculoskeletal Exam: Range of motion within normal limits to all pedal and ankle joints bilateral. Muscle strength 5/5 in all groups bilateral.  Pain on palpation to the plantar aspect of the second MTPJ left foot  Radiographic Exam:  Normal osseous mineralization. Joint spaces preserved. No fracture/dislocation/boney destruction.    Assessment: 1.  Second MTPJ capsulitis left   Plan of Care:  1. Patient evaluated. X-Rays reviewed.  2.  Patient declined a postsurgical shoe 3.  Offloading felt metatarsal pads were provided to apply to the insoles of the shoes to help offload pressure from the second MTPJ 4.  No NSAIDs prescribed.  Patient has history of colon resection 5.  Return to clinic in 3 weeks, if the  patient is not better we may need to consider steroid injection      Edrick Kins, DPM [facility]  Dr. Edrick Kins, DPM    [address], [place] [address]                Office [phone]  Fax [phone]

## 2020-07-24 ENCOUNTER — Other Ambulatory Visit: Payer: Self-pay

## 2020-07-24 ENCOUNTER — Ambulatory Visit: Payer: Medicare HMO | Admitting: Podiatry

## 2020-07-24 DIAGNOSIS — M7752 Other enthesopathy of left foot: Secondary | ICD-10-CM | POA: Diagnosis not present

## 2020-08-09 NOTE — Progress Notes (Signed)
   HPI: 75 y.o. female presenting today for follow-up evaluation of second MTPJ capsulitis to the left foot.  Patient states that overall she is significantly better.    Patient states that she was watching TV when she noticed cramping in her toes.  Is been painful ever since.  She denies a history of injury.    Past Medical History:  Diagnosis Date  . Arthritis    osteoarthritis   . Cancer ([place] [facility])    hx of basal cell skin cancer - 30 yrs ago per patient  . GERD (gastroesophageal reflux disease)    occasional   . H/O motion sickness   . Pneumonia 2013   aspirate pneumonia due to colonoscopy   . Thyroid nodule    small per patient and md just watching   . Urinary tract infection    hx of in 20s      Physical Exam: General: The patient is alert and oriented x3 in no acute distress.  Dermatology: Skin is warm, dry and supple bilateral lower extremities. Negative for open lesions or macerations.  Vascular: Palpable pedal pulses bilaterally. No edema or erythema noted. Capillary refill within normal limits.  Neurological: Epicritic and protective threshold grossly intact bilaterally.   Musculoskeletal Exam: Range of motion within normal limits to all pedal and ankle joints bilateral. Muscle strength 5/5 in all groups bilateral.  Overall there is significant improvement with pain on palpation to the plantar aspect of the second MTPJ  Assessment: 1.  Second MTPJ capsulitis left   Plan of Care:  1. Patient evaluated. 2.  Continue small offloading metatarsal pads to the insoles of the shoes 3.  Recommend good supportive stability sneakers 4.  Return to clinic as needed     Edrick Kins, DPM [facility]  Dr. Edrick Kins, DPM    [address], [place] [address]                Office [phone]  Fax [phone]

## 2020-10-02 DIAGNOSIS — L821 Other seborrheic keratosis: Secondary | ICD-10-CM | POA: Diagnosis not present

## 2020-10-02 DIAGNOSIS — L812 Freckles: Secondary | ICD-10-CM | POA: Diagnosis not present

## 2020-10-02 DIAGNOSIS — D1801 Hemangioma of skin and subcutaneous tissue: Secondary | ICD-10-CM | POA: Diagnosis not present

## 2020-10-02 DIAGNOSIS — Z85828 Personal history of other malignant neoplasm of skin: Secondary | ICD-10-CM | POA: Diagnosis not present

## 2020-11-01 DIAGNOSIS — E785 Hyperlipidemia, unspecified: Secondary | ICD-10-CM | POA: Diagnosis not present

## 2020-11-01 DIAGNOSIS — E041 Nontoxic single thyroid nodule: Secondary | ICD-10-CM | POA: Diagnosis not present

## 2020-11-01 DIAGNOSIS — R739 Hyperglycemia, unspecified: Secondary | ICD-10-CM | POA: Diagnosis not present

## 2020-11-07 DIAGNOSIS — R42 Dizziness and giddiness: Secondary | ICD-10-CM | POA: Diagnosis not present

## 2020-11-07 DIAGNOSIS — K5790 Diverticulosis of intestine, part unspecified, without perforation or abscess without bleeding: Secondary | ICD-10-CM | POA: Diagnosis not present

## 2020-11-07 DIAGNOSIS — R82998 Other abnormal findings in urine: Secondary | ICD-10-CM | POA: Diagnosis not present

## 2020-11-07 DIAGNOSIS — R739 Hyperglycemia, unspecified: Secondary | ICD-10-CM | POA: Diagnosis not present

## 2020-11-07 DIAGNOSIS — E041 Nontoxic single thyroid nodule: Secondary | ICD-10-CM | POA: Diagnosis not present

## 2020-11-07 DIAGNOSIS — E663 Overweight: Secondary | ICD-10-CM | POA: Diagnosis not present

## 2020-11-07 DIAGNOSIS — I700 Atherosclerosis of aorta: Secondary | ICD-10-CM | POA: Diagnosis not present

## 2020-11-07 DIAGNOSIS — R030 Elevated blood-pressure reading, without diagnosis of hypertension: Secondary | ICD-10-CM | POA: Diagnosis not present

## 2020-11-07 DIAGNOSIS — M1990 Unspecified osteoarthritis, unspecified site: Secondary | ICD-10-CM | POA: Diagnosis not present

## 2020-11-07 DIAGNOSIS — Z1339 Encounter for screening examination for other mental health and behavioral disorders: Secondary | ICD-10-CM | POA: Diagnosis not present

## 2020-11-07 DIAGNOSIS — Z1331 Encounter for screening for depression: Secondary | ICD-10-CM | POA: Diagnosis not present

## 2020-11-07 DIAGNOSIS — Z1212 Encounter for screening for malignant neoplasm of rectum: Secondary | ICD-10-CM | POA: Diagnosis not present

## 2020-11-07 DIAGNOSIS — Z0000 Encounter for general adult medical examination without abnormal findings: Secondary | ICD-10-CM | POA: Diagnosis not present

## 2020-11-07 DIAGNOSIS — E785 Hyperlipidemia, unspecified: Secondary | ICD-10-CM | POA: Diagnosis not present

## 2020-11-14 DIAGNOSIS — H524 Presbyopia: Secondary | ICD-10-CM | POA: Diagnosis not present

## 2020-11-14 DIAGNOSIS — H25813 Combined forms of age-related cataract, bilateral: Secondary | ICD-10-CM | POA: Diagnosis not present

## 2020-11-22 DIAGNOSIS — H25812 Combined forms of age-related cataract, left eye: Secondary | ICD-10-CM | POA: Diagnosis not present

## 2020-11-22 DIAGNOSIS — H2512 Age-related nuclear cataract, left eye: Secondary | ICD-10-CM | POA: Diagnosis not present

## 2020-12-23 DIAGNOSIS — Z0100 Encounter for examination of eyes and vision without abnormal findings: Secondary | ICD-10-CM | POA: Diagnosis not present

## 2021-01-09 DIAGNOSIS — R42 Dizziness and giddiness: Secondary | ICD-10-CM | POA: Diagnosis not present

## 2021-01-09 DIAGNOSIS — I10 Essential (primary) hypertension: Secondary | ICD-10-CM | POA: Diagnosis not present

## 2021-03-05 IMAGING — CT CT ABD-PELV W/ CM
2 of 5 series · 15 of 46 positions shown, 17 images · IV contrast (omnipaque)
Comparison: Abdominal ultrasound 02/01/2014; CT abdomen from
11/06/2010

CLINICAL DATA: Left lower quadrant abdominal pain

EXAM:
CT ABDOMEN AND PELVIS WITH CONTRAST
TECHNIQUE: Multidetector CT imaging of the abdomen and pelvis was performed
using the standard protocol following bolus administration of
intravenous contrast.
CONTRAST:  100mL OMNIPAQUE IOHEXOL 300 MG/ML  SOLN

[Series 2: axial st · axial · 0.68mm/px · z∈[+1096,+1481]mm · 12 of 91 slices shown, 14 images]
[im 7/91  soft-tissue]
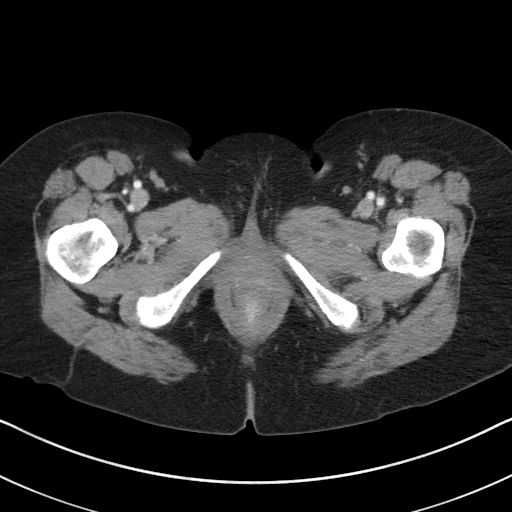
[im 7/91  bone]
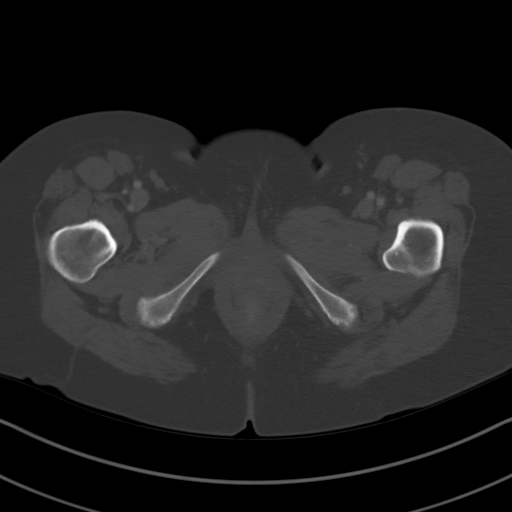
[im 14/91  soft-tissue]
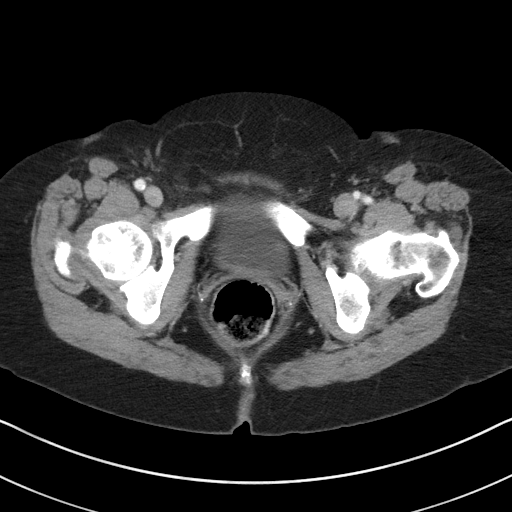
[im 21/91  soft-tissue]
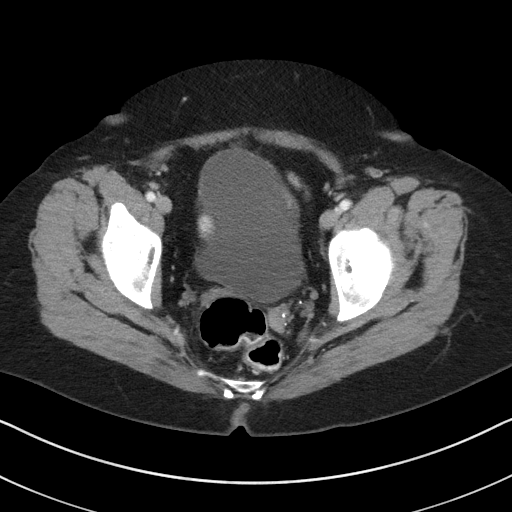
[im 28/91  soft-tissue]
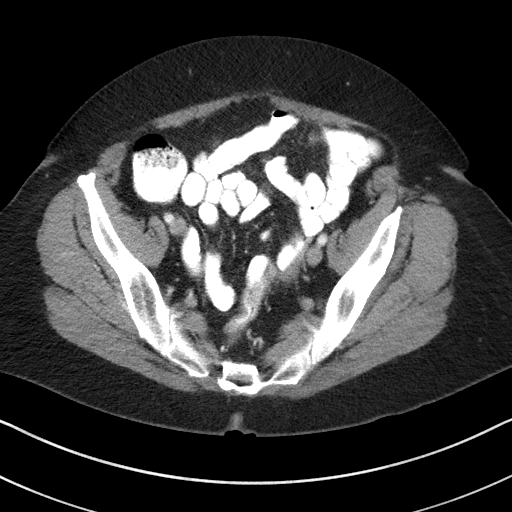
[im 35/91  soft-tissue]
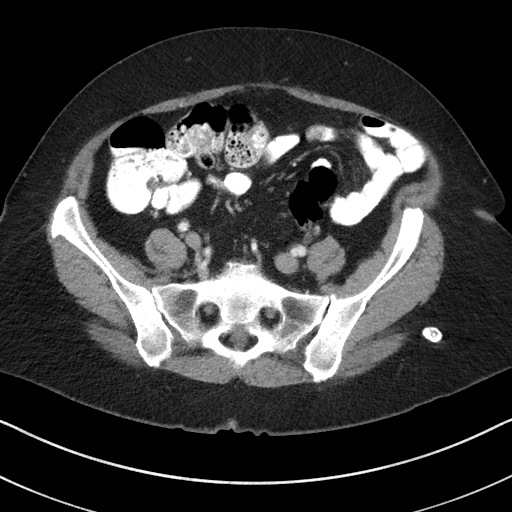
[im 42/91  soft-tissue]
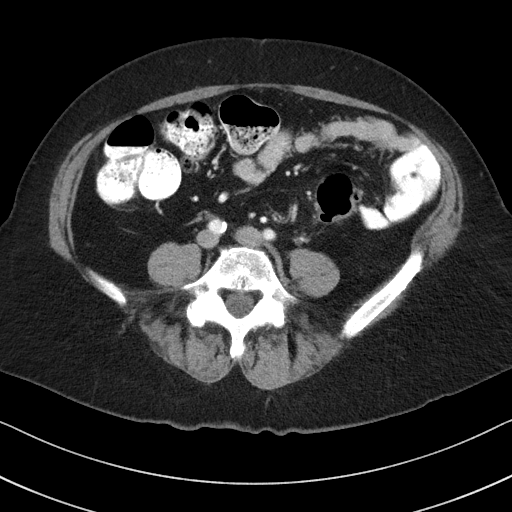
[im 49/91  soft-tissue]
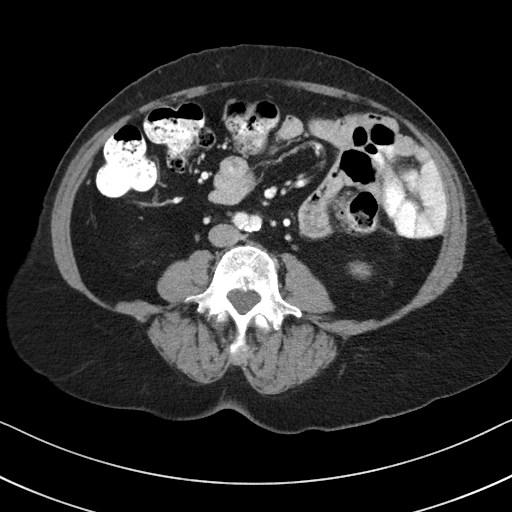
[im 56/91  soft-tissue]
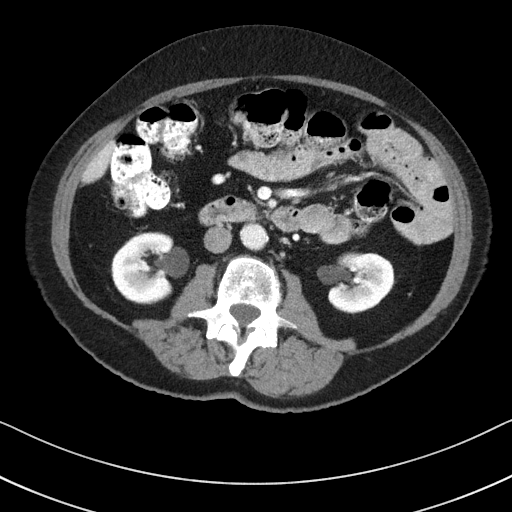
[im 63/91  soft-tissue]
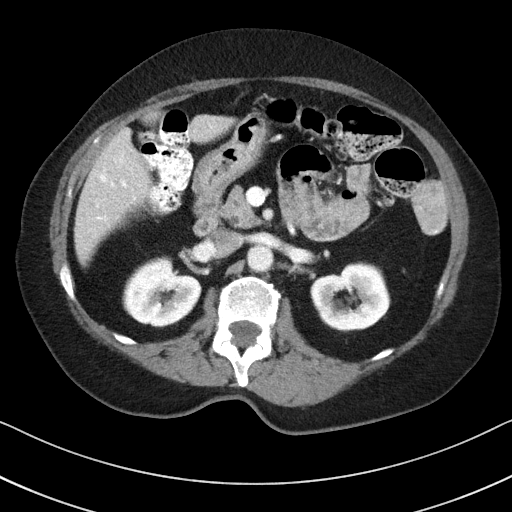
[im 63/91  bone]
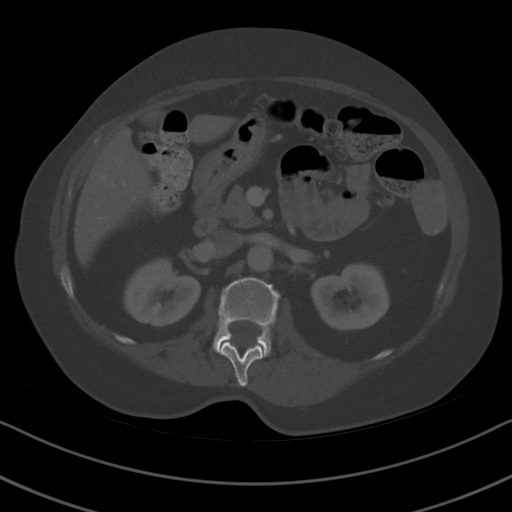
[im 70/91  soft-tissue]
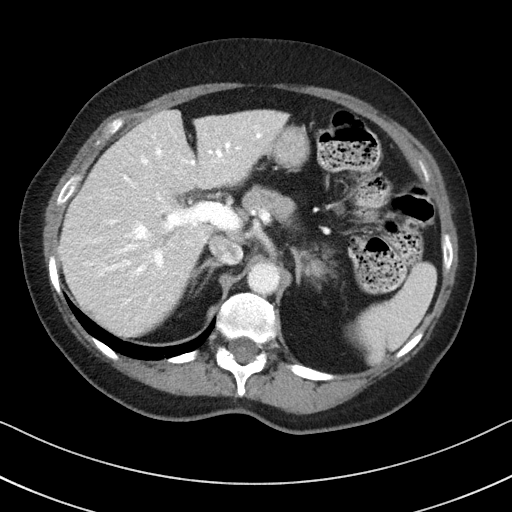
[im 77/91  soft-tissue]
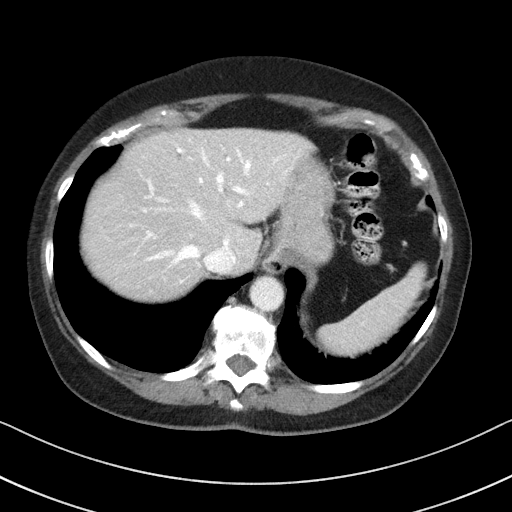
[im 84/91  soft-tissue]
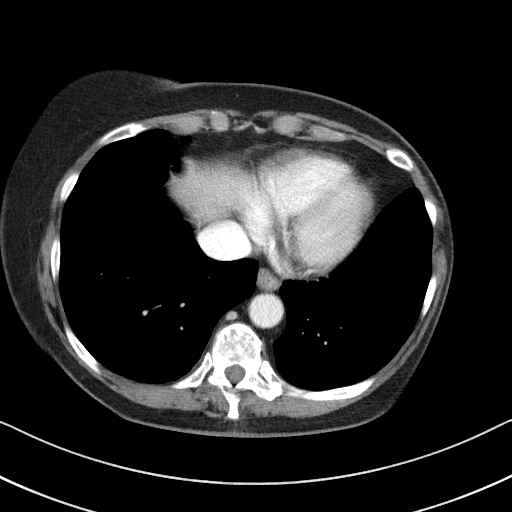

[Series 5: coronal st · coronal · 0.69mm/px · 3 of 101 slices shown]
[im 34/101  soft-tissue]
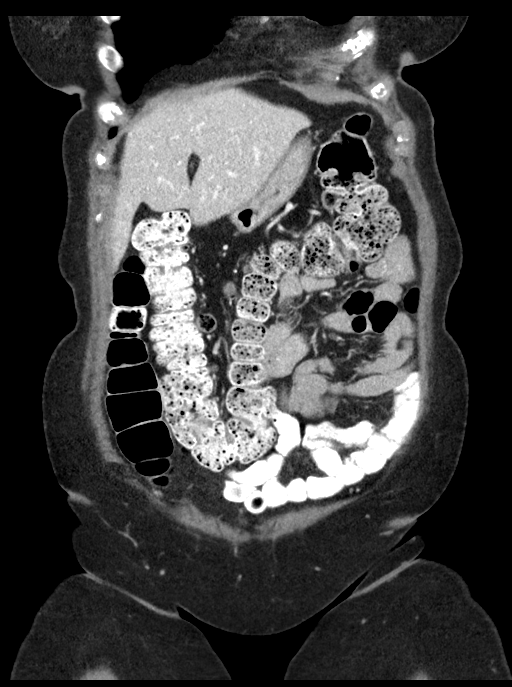
[im 45/101  soft-tissue]
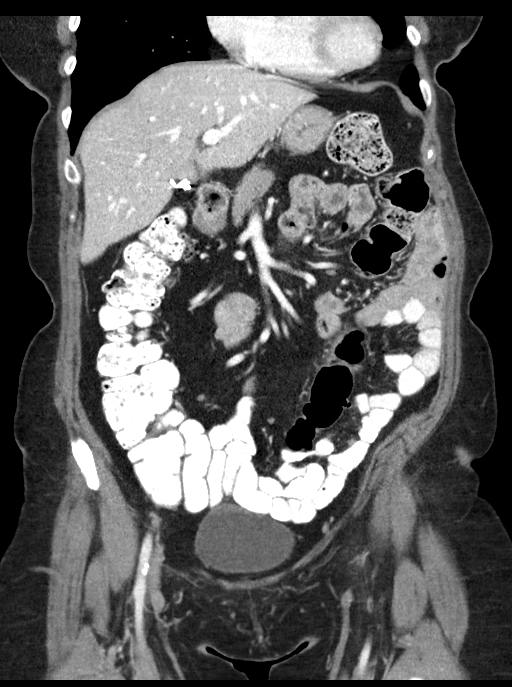
[im 56/101  soft-tissue]
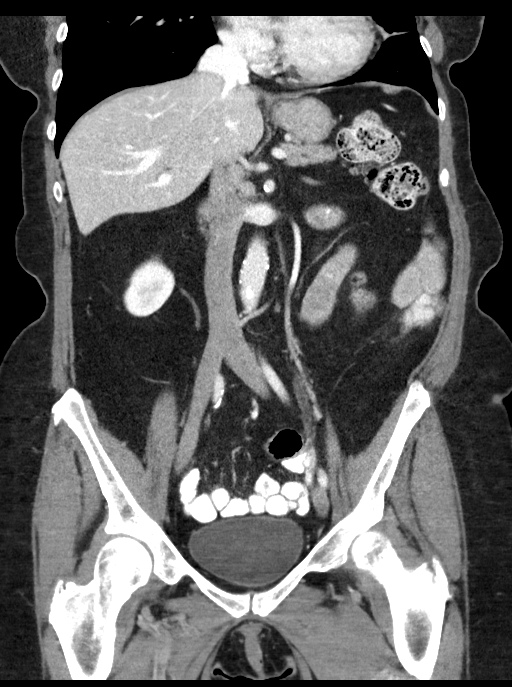

[15 of 46 positions shown; findings below may reference images not displayed]

FINDINGS: Lower chest: Subsegmental atelectasis in the posterior basal
segments of both lower lobes. Small type 1 hiatal hernia. Descending
thoracic aortic atherosclerotic vascular disease.

Hepatobiliary: Cholecystectomy. Otherwise unremarkable.

Pancreas: Pancreas divisum.

Spleen: Unremarkable

Adrenals/Urinary Tract: 2.2 by 2.4 cm simple appearing cyst of the
left kidney upper pole.

The adrenal glands appear normal. Urinary bladder appears normal.

Stomach/Bowel: Abnormal wall thickening primarily along the
posterior wall of the junction of the descending and sigmoid colon
on image 46/2, with localized density in this vicinity measuring up
to 2 cm in thickness, and with mild adjacent paracolic stranding.
There are adjacent diverticula and this process could be from acute
diverticulitis or colon malignancy/neoplasm.

Vascular/Lymphatic: Aortoiliac atherosclerotic vascular disease. No
pathologic adenopathy identified.

Reproductive: Uterus absent. Adnexa unremarkable.

Other: No supplemental non-categorized findings.

Musculoskeletal: Degenerative disc disease at L4-5 potentially
contributing to mild left foraminal stenosis.
IMPRESSION: 1. Abnormal wall thickening along the posterior wall of the junction
of the descending and sigmoid colon, with adjacent diverticula, and
mild adjacent paracolic stranding. This could be from acute
diverticulitis or colon malignancy/neoplasm. Correlate with the
patient's colon cancer screening history. If not recently
up-to-date, then after presumptive treatment for diverticulitis,
colonoscopy should be strongly considered.
2. Other imaging findings of potential clinical significance: Small
type 1 hiatal hernia. Pancreas divisum. Degenerative disc disease at
L4-5 potentially contributing to mild left foraminal stenosis.

Aortic Atherosclerosis ([id number]).

## 2021-03-26 DIAGNOSIS — H524 Presbyopia: Secondary | ICD-10-CM | POA: Diagnosis not present

## 2021-03-26 DIAGNOSIS — Z961 Presence of intraocular lens: Secondary | ICD-10-CM | POA: Diagnosis not present

## 2021-03-26 DIAGNOSIS — H25811 Combined forms of age-related cataract, right eye: Secondary | ICD-10-CM | POA: Diagnosis not present

## 2021-03-26 DIAGNOSIS — Z0100 Encounter for examination of eyes and vision without abnormal findings: Secondary | ICD-10-CM | POA: Diagnosis not present

## 2021-05-31 ENCOUNTER — Other Ambulatory Visit: Payer: Self-pay

## 2021-05-31 ENCOUNTER — Emergency Department (HOSPITAL_BASED_OUTPATIENT_CLINIC_OR_DEPARTMENT_OTHER): Payer: Medicare HMO | Admitting: Radiology

## 2021-05-31 ENCOUNTER — Encounter (HOSPITAL_BASED_OUTPATIENT_CLINIC_OR_DEPARTMENT_OTHER): Payer: Self-pay

## 2021-05-31 ENCOUNTER — Emergency Department (HOSPITAL_BASED_OUTPATIENT_CLINIC_OR_DEPARTMENT_OTHER)
Admission: EM | Admit: 2021-05-31 | Discharge: 2021-05-31 | Disposition: A | Discharge status: Home / Self Care | Payer: Medicare HMO | Attending: Emergency Medicine | Admitting: Emergency Medicine

## 2021-05-31 ENCOUNTER — Emergency Department (HOSPITAL_BASED_OUTPATIENT_CLINIC_OR_DEPARTMENT_OTHER): Payer: Medicare HMO

## 2021-05-31 DIAGNOSIS — S0083XA Contusion of other part of head, initial encounter: Secondary | ICD-10-CM | POA: Diagnosis not present

## 2021-05-31 DIAGNOSIS — B349 Viral infection, unspecified: Secondary | ICD-10-CM | POA: Insufficient documentation

## 2021-05-31 DIAGNOSIS — S0990XA Unspecified injury of head, initial encounter: Secondary | ICD-10-CM | POA: Diagnosis not present

## 2021-05-31 DIAGNOSIS — R55 Syncope and collapse: Secondary | ICD-10-CM | POA: Insufficient documentation

## 2021-05-31 DIAGNOSIS — Z20822 Contact with and (suspected) exposure to covid-19: Secondary | ICD-10-CM | POA: Diagnosis not present

## 2021-05-31 DIAGNOSIS — M47812 Spondylosis without myelopathy or radiculopathy, cervical region: Secondary | ICD-10-CM | POA: Diagnosis not present

## 2021-05-31 DIAGNOSIS — R059 Cough, unspecified: Secondary | ICD-10-CM | POA: Diagnosis not present

## 2021-05-31 DIAGNOSIS — Y92002 Bathroom of unspecified non-institutional (private) residence single-family (private) house as the place of occurrence of the external cause: Secondary | ICD-10-CM | POA: Diagnosis not present

## 2021-05-31 DIAGNOSIS — S8991XA Unspecified injury of right lower leg, initial encounter: Secondary | ICD-10-CM | POA: Diagnosis not present

## 2021-05-31 DIAGNOSIS — S8001XA Contusion of right knee, initial encounter: Secondary | ICD-10-CM | POA: Diagnosis not present

## 2021-05-31 DIAGNOSIS — W01198A Fall on same level from slipping, tripping and stumbling with subsequent striking against other object, initial encounter: Secondary | ICD-10-CM | POA: Diagnosis not present

## 2021-05-31 DIAGNOSIS — M25561 Pain in right knee: Secondary | ICD-10-CM | POA: Diagnosis not present

## 2021-05-31 LAB — BASIC METABOLIC PANEL
Anion gap: 10 (ref 5–15)
BUN: 15 mg/dL (ref 8–23)
CO2: 22 mmol/L (ref 22–32)
Calcium: 9.5 mg/dL (ref 8.9–10.3)
Chloride: 106 mmol/L (ref 98–111)
Creatinine, Ser: 0.91 mg/dL (ref 0.44–1.00)
GFR, Estimated: >60 mL/min (ref >60)
Glucose, Bld: 93 mg/dL (ref 70–99)
Potassium: 4 mmol/L (ref 3.5–5.1)
Sodium: 138 mmol/L (ref 135–145)

## 2021-05-31 LAB — CBC WITH DIFFERENTIAL/PLATELET
Abs Immature Granulocytes: 0.04 10*3/uL (ref 0.00–0.07)
Basophils Absolute: 0 10*3/uL (ref 0.0–0.1)
Basophils Relative: 0 %
Eosinophils Absolute: 0 10*3/uL (ref 0.0–0.5)
Eosinophils Relative: 0 %
HCT: 40.9 % (ref 36.0–46.0)
Hemoglobin: 13.5 g/dL (ref 12.0–15.0)
Immature Granulocytes: 1 %
Lymphocytes Relative: 22 %
Lymphs Abs: 1.9 10*3/uL (ref 0.7–4.0)
MCH: 29.8 pg (ref 26.0–34.0)
MCHC: 33 g/dL (ref 30.0–36.0)
MCV: 90.3 fL (ref 80.0–100.0)
Monocytes Absolute: 1.1 10*3/uL — ABNORMAL HIGH (ref 0.1–1.0)
Monocytes Relative: 13 %
Neutro Abs: 5.6 10*3/uL (ref 1.7–7.7)
Neutrophils Relative %: 64 %
Platelets: 278 10*3/uL (ref 150–400)
RBC: 4.53 MIL/uL (ref 3.87–5.11)
RDW: 14.3 % (ref 11.5–15.5)
WBC: 8.7 10*3/uL (ref 4.0–10.5)
nRBC: 0 % (ref 0.0–0.2)

## 2021-05-31 LAB — RESP PANEL BY RT-PCR (FLU A&B, COVID) ARPGX2
Influenza A by PCR: NEGATIVE
Influenza B by PCR: NEGATIVE
SARS Coronavirus 2 by RT PCR: NEGATIVE

## 2021-05-31 MED ORDER — SODIUM CHLORIDE 0.9 % IV BOLUS
500.0000 mL | Freq: Once | INTRAVENOUS | Status: AC
  Administered 2021-05-31: 500 mL via INTRAVENOUS

## 2021-05-31 NOTE — ED Triage Notes (Addendum)
Pt states fell at 1:30 this morning. Pt states she had syncope and fell. States did not loose consciousness. [place] again in the bath room hitting head. Lump on head anterior. No hx of heart issues. Rates pain 3/10. Pain did not take tylenol. States allergy symptoms.  ?

## 2021-05-31 NOTE — ED Provider Notes (Signed)
?[facility] ?Provider Note ? ? ?CSN: [id number] ?Arrival date & time: 05/31/21  1032 ? ?  ? ?History ? ?Chief Complaint  ?Patient presents with  ? Fall  ? ? ?Sabrina Levy is a 76 y.o. female. ? ?Patient is a 76 year old female who presents with 2 episodes of syncope.  This happened during the night.  She states that over the last 2 days she has had some nasal congestion which she thought was allergies although yesterday she had a temperature of around 100.  She has had some coughing as well.  She says that she was going to the kitchen to get some cough syrup during the night last night around 130 and after she had the cough syrup, she got nauseated and lightheaded and felt like she was going to pass out.  She says that this her does tend to make her nauseated at times.  She was trying to make it back to the bed and she passed out on the floor.  She hit her head on something.  She then got up with the help of her husband and had a large episode of watery diarrhea.  Her husband helped her back to the commode and after he left, she had a second syncopal episode when it seems like she tried to get up from the commode.  She has had some ongoing episodes of diarrhea and nausea with some intermittent vomiting.  Currently she says she feels better.  She still feels a little weak and wobbly but denies any dizziness.  No shortness of breath.  No abdominal pain.  No chest pain or palpitations.  No ongoing vomiting.  She has hematoma to her forehead and she has some soreness to her right knee but denies any other injuries from the fall. ? ? ?  ? ?Home Medications ?Prior to Admission medications   ?Medication Sig Start Date End Date Taking? Authorizing Provider  ?acetaminophen (TYLENOL) 500 MG tablet Take 500 mg by mouth every 6 (six) hours as needed for mild pain.    [provider]  ?amLODipine (NORVASC) 2.5 MG tablet Take 2.5 mg by mouth daily. 02/18/19   [provider]   ?amLODipine (NORVASC) 5 MG tablet Take 1 tablet by mouth daily. 04/21/20   [provider]  ?atorvastatin (LIPITOR) 20 MG tablet 10 mg daily.  09/08/18   [provider]  ?clotrimazole-betamethasone (LOTRISONE) cream clotrimazole-betamethasone 1 %-0.05 % topical cream ? APPLY TO AFFECTED AND SURROUNDING SKIN 2 TIMES A DAY FOR 2 WEEKS    [provider]  ?cyclobenzaprine (FLEXERIL) 5 MG tablet Take 1 tablet (5 mg total) by mouth at bedtime. 03/22/20   Pete Pelt, PA-C  ?HYDROcodone-acetaminophen (NORCO/VICODIN) 5-325 MG tablet Take 1 tablet by mouth every 4 (four) hours as needed. 06/14/19   Rancour, Annie Main, MD  ?lidocaine (LIDODERM) 5 % Place 1 patch onto the skin daily. Remove & Discard patch within 12 hours or as directed by MD 06/14/19   Ezequiel Essex, MD  ?losartan (COZAAR) 25 MG tablet Take 25 mg by mouth daily. 03/26/21   [provider]  ?omeprazole (PRILOSEC) 20 MG capsule omeprazole 20 mg capsule,delayed release    [provider]  ?polyethylene glycol (MIRALAX / GLYCOLAX) packet Take 17 g by mouth daily as needed for mild constipation.    [provider]  ?valACYclovir (VALTREX) 1000 MG tablet as needed.     [provider]  ?   ? ?Allergies    ?Aspirin,  Monosodium glutamate, Nsaids, Sulfa antibiotics, Sulfacetamide sodium, Sulfamethoxazole, and Tolmetin   ? ?Review of Systems   ?Review of Systems  ?Constitutional:  Positive for fatigue and fever. Negative for chills and diaphoresis.  ?HENT:  Positive for congestion and rhinorrhea. Negative for sneezing.   ?Eyes: Negative.   ?Respiratory:  Positive for cough. Negative for chest tightness and shortness of breath.   ?Cardiovascular:  Negative for chest pain and leg swelling.  ?Gastrointestinal:  Positive for diarrhea, nausea and vomiting. Negative for abdominal pain and blood in stool.  ?Genitourinary:  Negative for difficulty urinating, flank pain, frequency and hematuria.  ?Musculoskeletal:   Positive for arthralgias. Negative for back pain.  ?Skin:  Negative for rash.  ?Neurological:  Positive for syncope and headaches. Negative for dizziness, speech difficulty, weakness and numbness.  ? ?Physical Exam ?Updated Vital Signs ?BP 130/69   Pulse (!) 58   Temp 98.4 ?F (36.9 ?C) (Oral)   Resp 18   Ht '5\' 1"'$  (1.549 m)   Wt 66.2 kg   SpO2 97%   BMI 27.59 kg/m?  ?Physical Exam ?Constitutional:   ?   Appearance: She is well-developed.  ?HENT:  ?   Head: Normocephalic.  ?   Comments: Hematoma to the right forehead ?Eyes:  ?   Pupils: Pupils are equal, round, and reactive to light.  ?Neck:  ?   Comments: No pain along the cervical, thoracic or lumbosacral spine ?Cardiovascular:  ?   Rate and Rhythm: Normal rate and regular rhythm.  ?   Heart sounds: Normal heart sounds.  ?Pulmonary:  ?   Effort: Pulmonary effort is normal. No respiratory distress.  ?   Breath sounds: Normal breath sounds. No wheezing or rales.  ?Chest:  ?   Chest wall: No tenderness.  ?Abdominal:  ?   General: Bowel sounds are normal.  ?   Palpations: Abdomen is soft.  ?   Tenderness: There is no abdominal tenderness. There is no guarding or rebound.  ?Musculoskeletal:     ?   General: Normal range of motion.  ?   Comments: Small area of ecchymosis to the anterior portion of the right knee with some mild underlying bony tenderness.  There is no other pain on palpation or range of motion of the extremities, including the hips  ?Lymphadenopathy:  ?   Cervical: No cervical adenopathy.  ?Skin: ?   General: Skin is warm and dry.  ?   Findings: No rash.  ?Neurological:  ?   General: No focal deficit present.  ?   Mental Status: She is alert and oriented to person, place, and time.  ? ? ?ED Results / Procedures / Treatments   ?Labs ?(all labs ordered are listed, but only abnormal results are displayed) ?Labs Reviewed  ?CBC WITH DIFFERENTIAL/PLATELET - Abnormal; Notable for the following components:  ?    Result Value  ? Monocytes Absolute 1.1 (*)    ? All other components within normal limits  ?RESP PANEL BY RT-PCR (FLU A&B, COVID) [id number]  ?BASIC METABOLIC PANEL  ? ? ?EKG ?EKG Interpretation ? ?Date/Time:  Thursday May 31 2021 10:49:38 EDT ?Ventricular Rate:  70 ?PR Interval:  138 ?QRS Duration: 80 ?QT Interval:  350 ?QTC Calculation: 378 ?R Axis:   46 ?Text Interpretation: Normal sinus rhythm Low voltage QRS Borderline ECG When compared with ECG of 14-Jun-2019 04:01, PREVIOUS ECG IS PRESENT Since last tracing Confirmed by Malvin Johns [phone]) on 05/31/2021 11:10:18 AM ? ?Radiology ?DG Chest 2 View ? ?Result  Date: 05/31/2021 ?CLINICAL DATA:  Cough EXAM: CHEST - 2 VIEW COMPARISON:  CT chest done on 06/14/2019 FINDINGS: Cardiac size is within normal limits. There are no signs of pulmonary edema or focal pulmonary consolidation. There is no pleural effusion or pneumothorax. Surgical clips are seen in gallbladder fossa. IMPRESSION: There are no signs of pulmonary edema or focal pulmonary consolidation. Electronically Signed   By: Elmer Picker M.D.   On: 05/31/2021 11:59  ? ?CT Head Wo Contrast ? ?Result Date: 05/31/2021 ?CLINICAL DATA:  A 76 year old female presents for evaluation of head trauma. EXAM: CT HEAD WITHOUT CONTRAST CT CERVICAL SPINE WITHOUT CONTRAST TECHNIQUE: Multidetector CT imaging of the head and cervical spine was performed following the standard protocol without intravenous contrast. Multiplanar CT image reconstructions of the cervical spine were also generated. RADIATION DOSE REDUCTION: This exam was performed according to the departmental dose-optimization program which includes automated exposure control, adjustment of the mA and/or kV according to patient size and/or use of iterative reconstruction technique. COMPARISON:  August of 2019. FINDINGS: CT HEAD FINDINGS Brain: No evidence of acute infarction, hemorrhage, hydrocephalus, extra-axial collection or mass lesion/mass effect. Signs of atrophy as before. Vascular: No hyperdense  vessel or unexpected calcification. Skull: Normal. Negative for fracture or focal lesion. Sinuses/Orbits: Mild mucosal thickening of the sphenoid and ethmoid sinuses. Other: None CT CERVICAL SPINE FINDINGS Alignment:

## 2021-05-31 NOTE — ED Notes (Signed)
Patient transported to CT 

## 2021-07-02 DIAGNOSIS — Z86010 Personal history of colonic polyps: Secondary | ICD-10-CM | POA: Diagnosis not present

## 2021-07-02 DIAGNOSIS — K5900 Constipation, unspecified: Secondary | ICD-10-CM | POA: Diagnosis not present

## 2021-07-02 DIAGNOSIS — M549 Dorsalgia, unspecified: Secondary | ICD-10-CM | POA: Diagnosis not present

## 2021-07-02 DIAGNOSIS — K219 Gastro-esophageal reflux disease without esophagitis: Secondary | ICD-10-CM | POA: Diagnosis not present

## 2021-09-24 DIAGNOSIS — S20361A Insect bite (nonvenomous) of right front wall of thorax, initial encounter: Secondary | ICD-10-CM | POA: Diagnosis not present

## 2021-10-01 DIAGNOSIS — L812 Freckles: Secondary | ICD-10-CM | POA: Diagnosis not present

## 2021-10-01 DIAGNOSIS — L821 Other seborrheic keratosis: Secondary | ICD-10-CM | POA: Diagnosis not present

## 2021-10-01 DIAGNOSIS — H61002 Unspecified perichondritis of left external ear: Secondary | ICD-10-CM | POA: Diagnosis not present

## 2021-10-01 DIAGNOSIS — D1801 Hemangioma of skin and subcutaneous tissue: Secondary | ICD-10-CM | POA: Diagnosis not present

## 2021-10-01 DIAGNOSIS — B0089 Other herpesviral infection: Secondary | ICD-10-CM | POA: Diagnosis not present

## 2021-10-01 DIAGNOSIS — Z85828 Personal history of other malignant neoplasm of skin: Secondary | ICD-10-CM | POA: Diagnosis not present

## 2021-11-06 DIAGNOSIS — I10 Essential (primary) hypertension: Secondary | ICD-10-CM | POA: Diagnosis not present

## 2021-11-06 DIAGNOSIS — E041 Nontoxic single thyroid nodule: Secondary | ICD-10-CM | POA: Diagnosis not present

## 2021-11-06 DIAGNOSIS — R7989 Other specified abnormal findings of blood chemistry: Secondary | ICD-10-CM | POA: Diagnosis not present

## 2021-11-06 DIAGNOSIS — E785 Hyperlipidemia, unspecified: Secondary | ICD-10-CM | POA: Diagnosis not present

## 2021-11-06 DIAGNOSIS — R739 Hyperglycemia, unspecified: Secondary | ICD-10-CM | POA: Diagnosis not present

## 2021-11-16 DIAGNOSIS — Z0000 Encounter for general adult medical examination without abnormal findings: Secondary | ICD-10-CM | POA: Diagnosis not present

## 2021-11-21 DIAGNOSIS — Z0000 Encounter for general adult medical examination without abnormal findings: Secondary | ICD-10-CM | POA: Diagnosis not present

## 2021-11-21 DIAGNOSIS — I700 Atherosclerosis of aorta: Secondary | ICD-10-CM | POA: Diagnosis not present

## 2021-11-21 DIAGNOSIS — K5900 Constipation, unspecified: Secondary | ICD-10-CM | POA: Diagnosis not present

## 2021-11-21 DIAGNOSIS — R739 Hyperglycemia, unspecified: Secondary | ICD-10-CM | POA: Diagnosis not present

## 2021-11-21 DIAGNOSIS — K5790 Diverticulosis of intestine, part unspecified, without perforation or abscess without bleeding: Secondary | ICD-10-CM | POA: Diagnosis not present

## 2021-11-21 DIAGNOSIS — G629 Polyneuropathy, unspecified: Secondary | ICD-10-CM | POA: Diagnosis not present

## 2021-11-21 DIAGNOSIS — Z1389 Encounter for screening for other disorder: Secondary | ICD-10-CM | POA: Diagnosis not present

## 2021-11-21 DIAGNOSIS — R030 Elevated blood-pressure reading, without diagnosis of hypertension: Secondary | ICD-10-CM | POA: Diagnosis not present

## 2021-11-21 DIAGNOSIS — R82998 Other abnormal findings in urine: Secondary | ICD-10-CM | POA: Diagnosis not present

## 2021-11-21 DIAGNOSIS — Z1331 Encounter for screening for depression: Secondary | ICD-10-CM | POA: Diagnosis not present

## 2021-11-21 DIAGNOSIS — M1990 Unspecified osteoarthritis, unspecified site: Secondary | ICD-10-CM | POA: Diagnosis not present

## 2021-11-21 DIAGNOSIS — E041 Nontoxic single thyroid nodule: Secondary | ICD-10-CM | POA: Diagnosis not present

## 2021-11-21 DIAGNOSIS — K589 Irritable bowel syndrome without diarrhea: Secondary | ICD-10-CM | POA: Diagnosis not present

## 2021-11-22 ENCOUNTER — Telehealth: Payer: Self-pay

## 2021-11-22 NOTE — Telephone Encounter (Signed)
Talked with patient concerning getting an earlier appt. Advised patient to call back daily to see if there's a cancellation for Dr. Ninfa Linden.  Patient voiced that she understands.

## 2021-12-05 ENCOUNTER — Encounter: Payer: Self-pay | Admitting: Orthopaedic Surgery

## 2021-12-05 ENCOUNTER — Ambulatory Visit: Payer: Medicare HMO | Admitting: Orthopaedic Surgery

## 2021-12-05 ENCOUNTER — Ambulatory Visit (INDEPENDENT_AMBULATORY_CARE_PROVIDER_SITE_OTHER): Payer: Medicare HMO

## 2021-12-05 DIAGNOSIS — M545 Low back pain, unspecified: Secondary | ICD-10-CM

## 2021-12-05 MED ORDER — GABAPENTIN 100 MG PO CAPS: 100.0000 mg | ORAL_CAPSULE | Freq: Every day | ORAL | 1 refills | Status: DC

## 2021-12-05 NOTE — Progress Notes (Signed)
The patient is a very pleasant 76 year old female who actually know well.  I have recently replaced her husband's hip.  She said she actually hurt her back a few weeks ago when she was helping put his compressive socks on and she felt a twinge of pain in the right side of her lower back.  There is no radicular component and she said the Tylenol is really helped.  She does have a history of neuropathy and is seen her primary care physician about this.  She is not a diabetic.  She has had a burning type of quality of her pain.  She said heating pad has helped.  She said her primary care doctor said that she could try Neurontin in the evening relates to the small dose of 100 mg.  I agree with this as well in terms of trying this for her neuropathy.  This could help with her back as well.  On exam she has basically just some mild facet joint related pain to the lower aspect of her spine on the right side.  The remainder of her lower extremity exam is entirely normal.  She does have neuropathy in her feet but again her muscle tone is great and there is negative straight leg raise and she has good flexion extension of the lumbar spine with minimal discomfort.  2 views lumbar spine show no acute findings with good alignment overall.  Since she is doing so well I agree with her taking Tylenol as needed.  We can try her on 100 mg of Neurontin at bedtime to see if this will help with her neuropathy.  She agrees with this treatment plan.

## 2022-01-23 ENCOUNTER — Ambulatory Visit: Payer: Medicare HMO | Admitting: Podiatry

## 2022-01-23 DIAGNOSIS — M722 Plantar fascial fibromatosis: Secondary | ICD-10-CM | POA: Diagnosis not present

## 2022-01-23 DIAGNOSIS — R52 Pain, unspecified: Secondary | ICD-10-CM | POA: Diagnosis not present

## 2022-01-23 MED ORDER — BETAMETHASONE SOD PHOS & ACET 6 (3-3) MG/ML IJ SUSP: 3.0000 mg | Freq: Once | INTRAMUSCULAR | Status: AC

## 2022-01-23 NOTE — Progress Notes (Signed)
Chief Complaint  Patient presents with   Foot Pain    Burning sensations of both feet x 6 months. Pt states burning sensations are off and on. Pt is not diabetic but has chronic back pain. Pt has tried Gabapentin. Pt states it did not help.     HPI: 76 y.o. female presenting today for new complaint of pain and tenderness associated to the left plantar heel.  This has been ongoing for several months.  She has tried different shoes with minimal relief.  Patient believes that she may have plantar fasciitis based on her research. Patient also states that she has numbness and tingling to the bilateral forefoot.  She does have a history of chronic lower back pain.  She has tried gabapentin but it does not alleviate her symptoms and it makes her very drowsy throughout the day.  Past Medical History:  Diagnosis Date   Arthritis    osteoarthritis    Cancer ([place])    hx of basal cell skin cancer - 30 yrs ago per patient   GERD (gastroesophageal reflux disease)    occasional    H/O motion sickness    Pneumonia 2013   aspirate pneumonia due to colonoscopy    Thyroid nodule    small per patient and md just watching    Urinary tract infection    hx of in 20s     Past Surgical History:  Procedure Laterality Date   ABDOMINAL HYSTERECTOMY     CHOLECYSTECTOMY N/A 05/06/2014   Procedure: LAPAROSCOPIC CHOLECYSTECTOMY WITH INTRAOPERATIVE CHOLANGIOGRAM;  Surgeon: Armandina Gemma, MD;  Location: [facility];  Service: General;  Laterality: N/A;   COLON RESECTION  2002    NASAL SINUS SURGERY  2002     Allergies  Allergen Reactions   Aspirin     Burns stomach   Monosodium Glutamate Other (See Comments)    " tears up stomach"    Nsaids     Burns stomach   Sulfa Antibiotics Other (See Comments)    Unknown    Sulfacetamide Sodium     Unknown    Sulfamethoxazole     Unsure of reaction   Tolmetin     Burns stomach     Physical Exam: General: The patient is alert and oriented x3 in no acute  distress.  Dermatology: Skin is warm, dry and supple bilateral lower extremities. Negative for open lesions or macerations.  Vascular: Palpable pedal pulses bilaterally. Capillary refill within normal limits.  Negative for any significant edema or erythema  Neurological: Light touch and protective threshold grossly intact  Musculoskeletal Exam: No pedal deformities noted.  There is some pain on palpation to the medial calcaneal tubercle along the plantar fascia left  Assessment: 1.  Plantar fasciitis left   Plan of Care:  1. Patient evaluated.  2.  Injection of 0.5 cc Celestone Soluspan injected in the plantar fascia left 3.  No NSAIDs.  Patient has history of colon resection 4.  Patient declined a Medrol Dosepak.  She says it creates facial swelling 5.  Recommend OTC Tylenol as needed 6.  Plantar fascia brace dispensed.  Wear daily 7.  Recommend good supportive shoes at [employer] running store 8.  Return to clinic 6 weeks     Edrick Kins, DPM [facility]  Dr. Edrick Kins, DPM    [address]  [place], [place] [address]                Office [phone]  Fax [phone]

## 2022-01-24 DIAGNOSIS — H2511 Age-related nuclear cataract, right eye: Secondary | ICD-10-CM | POA: Diagnosis not present

## 2022-02-18 DIAGNOSIS — Z86010 Personal history of colonic polyps: Secondary | ICD-10-CM | POA: Diagnosis not present

## 2022-02-18 DIAGNOSIS — R1032 Left lower quadrant pain: Secondary | ICD-10-CM | POA: Diagnosis not present

## 2022-02-22 DIAGNOSIS — K5790 Diverticulosis of intestine, part unspecified, without perforation or abscess without bleeding: Secondary | ICD-10-CM | POA: Diagnosis not present

## 2022-02-22 DIAGNOSIS — R1032 Left lower quadrant pain: Secondary | ICD-10-CM | POA: Diagnosis not present

## 2022-02-22 DIAGNOSIS — K5900 Constipation, unspecified: Secondary | ICD-10-CM | POA: Diagnosis not present

## 2022-02-22 DIAGNOSIS — K589 Irritable bowel syndrome without diarrhea: Secondary | ICD-10-CM | POA: Diagnosis not present

## 2022-02-22 DIAGNOSIS — R1031 Right lower quadrant pain: Secondary | ICD-10-CM | POA: Diagnosis not present

## 2022-03-04 ENCOUNTER — Ambulatory Visit: Payer: Medicare HMO | Admitting: Podiatry

## 2022-03-13 DIAGNOSIS — H2511 Age-related nuclear cataract, right eye: Secondary | ICD-10-CM | POA: Diagnosis not present

## 2022-04-08 DIAGNOSIS — Z0100 Encounter for examination of eyes and vision without abnormal findings: Secondary | ICD-10-CM | POA: Diagnosis not present

## 2022-04-08 DIAGNOSIS — H524 Presbyopia: Secondary | ICD-10-CM | POA: Diagnosis not present

## 2022-04-23 DIAGNOSIS — Z961 Presence of intraocular lens: Secondary | ICD-10-CM | POA: Diagnosis not present

## 2022-05-09 DIAGNOSIS — K5730 Diverticulosis of large intestine without perforation or abscess without bleeding: Secondary | ICD-10-CM | POA: Diagnosis not present

## 2022-05-09 DIAGNOSIS — Z980 Intestinal bypass and anastomosis status: Secondary | ICD-10-CM | POA: Diagnosis not present

## 2022-05-09 DIAGNOSIS — R1032 Left lower quadrant pain: Secondary | ICD-10-CM | POA: Diagnosis not present

## 2022-05-20 DIAGNOSIS — Z1231 Encounter for screening mammogram for malignant neoplasm of breast: Secondary | ICD-10-CM | POA: Diagnosis not present

## 2022-05-20 DIAGNOSIS — Z01419 Encounter for gynecological examination (general) (routine) without abnormal findings: Secondary | ICD-10-CM | POA: Diagnosis not present

## 2022-07-01 DIAGNOSIS — R140 Abdominal distension (gaseous): Secondary | ICD-10-CM | POA: Diagnosis not present

## 2022-07-01 DIAGNOSIS — K5900 Constipation, unspecified: Secondary | ICD-10-CM | POA: Diagnosis not present

## 2022-07-01 DIAGNOSIS — K5730 Diverticulosis of large intestine without perforation or abscess without bleeding: Secondary | ICD-10-CM | POA: Diagnosis not present

## 2022-07-08 DIAGNOSIS — Z961 Presence of intraocular lens: Secondary | ICD-10-CM | POA: Diagnosis not present

## 2022-07-31 DIAGNOSIS — Z8249 Family history of ischemic heart disease and other diseases of the circulatory system: Secondary | ICD-10-CM | POA: Diagnosis not present

## 2022-07-31 DIAGNOSIS — R2689 Other abnormalities of gait and mobility: Secondary | ICD-10-CM | POA: Diagnosis not present

## 2022-07-31 DIAGNOSIS — I10 Essential (primary) hypertension: Secondary | ICD-10-CM | POA: Diagnosis not present

## 2022-07-31 DIAGNOSIS — R42 Dizziness and giddiness: Secondary | ICD-10-CM | POA: Diagnosis not present

## 2022-07-31 DIAGNOSIS — M5030 Other cervical disc degeneration, unspecified cervical region: Secondary | ICD-10-CM | POA: Diagnosis not present

## 2022-11-11 DIAGNOSIS — H61002 Unspecified perichondritis of left external ear: Secondary | ICD-10-CM | POA: Diagnosis not present

## 2022-11-11 DIAGNOSIS — L812 Freckles: Secondary | ICD-10-CM | POA: Diagnosis not present

## 2022-11-11 DIAGNOSIS — Z85828 Personal history of other malignant neoplasm of skin: Secondary | ICD-10-CM | POA: Diagnosis not present

## 2022-11-11 DIAGNOSIS — D1801 Hemangioma of skin and subcutaneous tissue: Secondary | ICD-10-CM | POA: Diagnosis not present

## 2022-11-11 DIAGNOSIS — L821 Other seborrheic keratosis: Secondary | ICD-10-CM | POA: Diagnosis not present

## 2022-12-21 DIAGNOSIS — Z23 Encounter for immunization: Secondary | ICD-10-CM | POA: Diagnosis not present

## 2023-01-14 DIAGNOSIS — H2513 Age-related nuclear cataract, bilateral: Secondary | ICD-10-CM | POA: Diagnosis not present

## 2023-01-21 DIAGNOSIS — E041 Nontoxic single thyroid nodule: Secondary | ICD-10-CM | POA: Diagnosis not present

## 2023-01-21 DIAGNOSIS — R739 Hyperglycemia, unspecified: Secondary | ICD-10-CM | POA: Diagnosis not present

## 2023-01-21 DIAGNOSIS — Z1212 Encounter for screening for malignant neoplasm of rectum: Secondary | ICD-10-CM | POA: Diagnosis not present

## 2023-01-21 DIAGNOSIS — Z1389 Encounter for screening for other disorder: Secondary | ICD-10-CM | POA: Diagnosis not present

## 2023-01-21 DIAGNOSIS — E785 Hyperlipidemia, unspecified: Secondary | ICD-10-CM | POA: Diagnosis not present

## 2023-01-27 DIAGNOSIS — R82998 Other abnormal findings in urine: Secondary | ICD-10-CM | POA: Diagnosis not present

## 2023-01-27 DIAGNOSIS — I700 Atherosclerosis of aorta: Secondary | ICD-10-CM | POA: Diagnosis not present

## 2023-01-27 DIAGNOSIS — M1990 Unspecified osteoarthritis, unspecified site: Secondary | ICD-10-CM | POA: Diagnosis not present

## 2023-01-27 DIAGNOSIS — G629 Polyneuropathy, unspecified: Secondary | ICD-10-CM | POA: Diagnosis not present

## 2023-01-27 DIAGNOSIS — R42 Dizziness and giddiness: Secondary | ICD-10-CM | POA: Diagnosis not present

## 2023-01-27 DIAGNOSIS — I10 Essential (primary) hypertension: Secondary | ICD-10-CM | POA: Diagnosis not present

## 2023-01-27 DIAGNOSIS — Z0000 Encounter for general adult medical examination without abnormal findings: Secondary | ICD-10-CM | POA: Diagnosis not present

## 2023-01-27 DIAGNOSIS — E785 Hyperlipidemia, unspecified: Secondary | ICD-10-CM | POA: Diagnosis not present

## 2023-01-27 DIAGNOSIS — E041 Nontoxic single thyroid nodule: Secondary | ICD-10-CM | POA: Diagnosis not present

## 2023-01-27 DIAGNOSIS — G319 Degenerative disease of nervous system, unspecified: Secondary | ICD-10-CM | POA: Diagnosis not present

## 2023-05-05 DIAGNOSIS — H35372 Puckering of macula, left eye: Secondary | ICD-10-CM | POA: Diagnosis not present

## 2023-05-05 DIAGNOSIS — Z961 Presence of intraocular lens: Secondary | ICD-10-CM | POA: Diagnosis not present

## 2023-07-09 DIAGNOSIS — H43811 Vitreous degeneration, right eye: Secondary | ICD-10-CM | POA: Diagnosis not present

## 2023-07-25 DIAGNOSIS — I10 Essential (primary) hypertension: Secondary | ICD-10-CM | POA: Diagnosis not present

## 2023-07-25 DIAGNOSIS — R2689 Other abnormalities of gait and mobility: Secondary | ICD-10-CM | POA: Diagnosis not present

## 2023-07-25 DIAGNOSIS — Z9181 History of falling: Secondary | ICD-10-CM | POA: Diagnosis not present

## 2023-07-25 DIAGNOSIS — G319 Degenerative disease of nervous system, unspecified: Secondary | ICD-10-CM | POA: Diagnosis not present

## 2023-07-31 DIAGNOSIS — R42 Dizziness and giddiness: Secondary | ICD-10-CM | POA: Diagnosis not present

## 2023-08-13 DIAGNOSIS — R2689 Other abnormalities of gait and mobility: Secondary | ICD-10-CM | POA: Diagnosis not present

## 2023-08-13 DIAGNOSIS — Z9181 History of falling: Secondary | ICD-10-CM | POA: Diagnosis not present

## 2023-08-19 DIAGNOSIS — Z9181 History of falling: Secondary | ICD-10-CM | POA: Diagnosis not present

## 2023-08-19 DIAGNOSIS — R2689 Other abnormalities of gait and mobility: Secondary | ICD-10-CM | POA: Diagnosis not present

## 2023-08-26 DIAGNOSIS — R2689 Other abnormalities of gait and mobility: Secondary | ICD-10-CM | POA: Diagnosis not present

## 2023-08-26 DIAGNOSIS — Z9181 History of falling: Secondary | ICD-10-CM | POA: Diagnosis not present

## 2023-09-02 DIAGNOSIS — Z9181 History of falling: Secondary | ICD-10-CM | POA: Diagnosis not present

## 2023-09-02 DIAGNOSIS — R2689 Other abnormalities of gait and mobility: Secondary | ICD-10-CM | POA: Diagnosis not present

## 2023-09-09 DIAGNOSIS — R2689 Other abnormalities of gait and mobility: Secondary | ICD-10-CM | POA: Diagnosis not present

## 2023-09-09 DIAGNOSIS — Z9181 History of falling: Secondary | ICD-10-CM | POA: Diagnosis not present

## 2023-09-23 DIAGNOSIS — Z9181 History of falling: Secondary | ICD-10-CM | POA: Diagnosis not present

## 2023-09-23 DIAGNOSIS — R2689 Other abnormalities of gait and mobility: Secondary | ICD-10-CM | POA: Diagnosis not present

## 2023-10-07 DIAGNOSIS — R2689 Other abnormalities of gait and mobility: Secondary | ICD-10-CM | POA: Diagnosis not present

## 2023-10-07 DIAGNOSIS — Z9181 History of falling: Secondary | ICD-10-CM | POA: Diagnosis not present

## 2023-10-14 DIAGNOSIS — Z9181 History of falling: Secondary | ICD-10-CM | POA: Diagnosis not present

## 2023-10-14 DIAGNOSIS — R2689 Other abnormalities of gait and mobility: Secondary | ICD-10-CM | POA: Diagnosis not present

## 2023-10-19 NOTE — Progress Notes (Unsigned)
 [facility]  PATIENT: Sabrina Levy DOB: April 08, 1945  REFERRING DOCTOR OR PCP: Norleen Jungling, MD SOURCE: Patient, notes from Dr. Jungling, imaging and lab reports, CT images personally reviewed.  _________________________________   HISTORICAL  CHIEF COMPLAINT:  Chief Complaint  Patient presents with   New Patient (Initial Visit)    Pt in room 11. Alone. Paper referral [facility] for balance issues. Pt states she has tiny bit of vertigo. Takes exercise classes at the [facility]. Patient in currently in physical therapy. No falls,     HISTORY OF PRESENT ILLNESS:  I had the pleasure of seeing your patient, Sabrina Levy, at [facility] for neurologic consultation regarding her vertigo and gait disturbance.  She is an 78 year old woman who has had difficulty for balance, initially noted 25 years ago.   She has noted being dizzy with some lightheadedness and gait ataxia that started 07/24/2023.    She denies frank vertigo.  Hearing is symmetric.   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    She notes vertigo lasting a few seconds when she rolls over in bed.     She started vestibular therapy in early June and feels this has helped a lot.   She is feeling close to her pre-May baseline.    She is no able to stand on one leg again.     She denies weakness, numbness or significant bladder issues.   She has had constipation helped by prunes.     She is otherwise fairly healthy and just has well controlled HTN and HLD.    Denies any new physical or visual symptoms.     She recently saw ENT 07/31/2023 - they recommended vestibular therapy.     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     She has no recent imaging.  I reviewed CT scan from 2023 after she had a head injury from a fall.  The brain was normal for age.  She does have some degenerative changes maximal at C4-C5 where there is at least mild spinal  stenosis.  Imaging: CT scan 05/31/2021 showed minimal generalized cortical atrophy and some white matter changes consistent with mild chronic microvascular ischemic change.  There were no acute findings.  CT scan of the cervical spine 05/31/2021 showed mild retrolisthesis and endplate spurring and disc calcification at C4-C5 causing at least mild spinal stenosis.  There were milder changes at C5-C6 and C6-C7.  REVIEW OF SYSTEMS: Constitutional: No fevers, chills, sweats, or change in appetite Eyes: No visual changes, double vision, eye pain Ear, nose and throat: No hearing loss, ear pain, nasal congestion, sore throat Cardiovascular: No chest pain, palpitations Respiratory:  No shortness of breath at rest or with exertion.   No wheezes GastrointestinaI: No nausea, vomiting, diarrhea, abdominal pain, fecal incontinence Genitourinary:  No dysuria, urinary retention or frequency.  No nocturia. Musculoskeletal:  No neck pain, back pain Integumentary: No rash, pruritus, skin lesions Neurological: as above Psychiatric: No depression at this time.  No anxiety Endocrine: No palpitations, diaphoresis, change in appetite, change in weigh or increased thirst Hematologic/Lymphatic:  No anemia, purpura, petechiae. Allergic/Immunologic: No itchy/runny eyes, nasal congestion, recent allergic reactions, rashes  ALLERGIES: Allergies  Allergen Reactions   Aspirin     Burns stomach   Monosodium Glutamate Other (See Comments)     tears up stomach    Nsaids     Burns stomach   Sulfa Antibiotics Other (  See Comments)    Unknown    Sulfacetamide Sodium     Unknown    Sulfamethoxazole     Unsure of reaction   Tolmetin     Burns stomach    HOME MEDICATIONS:  Current Outpatient Medications:    acetaminophen  (TYLENOL ) 500 MG tablet, Take 500 mg by mouth every 6 (six) hours as needed for mild pain., Disp: , Rfl:    atorvastatin (LIPITOR) 10 MG tablet, Take 10 mg by mouth daily., Disp: , Rfl:     clotrimazole-betamethasone  (LOTRISONE) cream, clotrimazole-betamethasone  1 %-0.05 % topical cream  APPLY TO AFFECTED AND SURROUNDING SKIN 2 TIMES A DAY FOR 2 WEEKS, Disp: , Rfl:    losartan (COZAAR) 25 MG tablet, Take 25 mg by mouth daily., Disp: , Rfl:    polyethylene glycol (MIRALAX / GLYCOLAX) packet, Take 17 g by mouth daily as needed for mild constipation., Disp: , Rfl:    valACYclovir (VALTREX) 1000 MG tablet, as needed. , Disp: , Rfl:   Current Facility-Administered Medications:    betamethasone  acetate-betamethasone  sodium phosphate  (CELESTONE ) injection 3 mg, 3 mg, Intra-articular, Once, Evans, Thresa HERO, DPM  PAST MEDICAL HISTORY: Past Medical History:  Diagnosis Date   Arthritis    osteoarthritis    Cancer ([facility])    hx of basal cell skin cancer - 30 yrs ago per patient   GERD (gastroesophageal reflux disease)    occasional    H/O motion sickness    Pneumonia 2013   aspirate pneumonia due to colonoscopy    Thyroid  nodule    small per patient and md just watching    Urinary tract infection    hx of in 20s     PAST SURGICAL HISTORY: Past Surgical History:  Procedure Laterality Date   ABDOMINAL HYSTERECTOMY     CHOLECYSTECTOMY N/A 05/06/2014   Procedure: LAPAROSCOPIC CHOLECYSTECTOMY WITH INTRAOPERATIVE CHOLANGIOGRAM;  Surgeon: Krystal Spinner, MD;  Location: [facility];  Service: General;  Laterality: N/A;   COLON RESECTION  2002    NASAL SINUS SURGERY  2002     FAMILY HISTORY: Family History  Problem Relation Age of Onset   Heart attack Father 40   Heart disease Brother 27       Valve replacement    SOCIAL HISTORY: Social History   Socioeconomic History   Marital status: Married    Spouse name: Not on file   Number of children: Not on file   Years of education: Not on file   Highest education level: Not on file  Occupational History   Not on file  Tobacco Use   Smoking status: Former   Smokeless tobacco: Never  Vaping Use   Vaping status: Never Used  Substance  and Sexual Activity   Alcohol  use: No   Drug use: No   Sexual activity: Not on file  Other Topics Concern   Not on file  Social History Narrative   Not on file   Social Drivers of Health   Financial Resource Strain: Not on file  Food Insecurity: Not on file  Transportation Needs: Not on file  Physical Activity: Not on file  Stress: Not on file  Social Connections: Not on file  Intimate Partner Violence: Not on file       PHYSICAL EXAM  Vitals:   10/21/23 1541  BP: 115/65  Pulse: 64  SpO2: 97%  Weight: 147 lb (66.7 kg)  Height: 5' 1 (1.549 m)    Body mass index is 27.78 kg/m.   General:  The patient is well-developed and well-nourished and in no acute distress  HEENT:  Head is [place]/AT.  Sclera are anicteric.   Neck: No carotid bruits are noted.  The neck is nontender.  Cardiovascular: The heart has a regular rate and rhythm with a normal S1 and S2. There were no murmurs, gallops or rubs.    Skin: Extremities are without rash or  edema.  Musculoskeletal:  Back is nontender  Neurologic Exam  Mental status: The patient is alert and oriented x 3 at the time of the examination. The patient has apparent normal recent and remote memory, with an apparently normal attention span and concentration ability.   Speech is normal.  Cranial nerves: Extraocular movements are full.  There is good facial sensation to soft touch bilaterally.Facial strength is normal.  Trapezius and sternocleidomastoid strength is normal. No dysarthria is noted.  The tongue is midline, and the patient has symmetric elevation of the soft palate. No obvious hearing deficits are noted.   Weber did not lateralize.  Motor:  Muscle bulk is normal.   Tone is normal. Strength is  5 / 5 in all 4 extremities.   Sensory: Sensory testing is intact to pinprick, soft touch and vibration sensation in all 4 extremities.  Coordination: Cerebellar testing reveals good finger-nose-finger and heel-to-shin  bilaterally.  Gait and station: Station is normal.  The gait is normal for age.  Tandem gait is wide but likely normal for age.. Romberg is negative.   Reflexes: Deep tendon reflexes are symmetric and 1+ bilaterally.   Plantar responses are flexor.    DIAGNOSTIC DATA (LABS, IMAGING, TESTING) - I reviewed patient records, labs, notes, testing and imaging myself where available.  Lab Results  Component Value Date   WBC 8.7 05/31/2021   HGB 13.5 05/31/2021   HCT 40.9 05/31/2021   MCV 90.3 05/31/2021   PLT 278 05/31/2021      Component Value Date/Time   NA 138 05/31/2021 1134   K 4.0 05/31/2021 1134   CL 106 05/31/2021 1134   CO2 22 05/31/2021 1134   GLUCOSE 93 05/31/2021 1134   BUN 15 05/31/2021 1134   CREATININE 0.91 05/31/2021 1134   CALCIUM 9.5 05/31/2021 1134   PROT 7.3 06/14/2019 0404   ALBUMIN 4.2 06/14/2019 0404   AST 25 06/14/2019 0404   ALT 21 06/14/2019 0404   ALKPHOS 82 06/14/2019 0404   BILITOT 0.8 06/14/2019 0404   GFRNONAA >60 05/31/2021 1134   GFRAA >60 06/14/2019 0404        ASSESSMENT AND PLAN  Gait disturbance  Vertigo   In summary, Ms. Nicks is an 78 year old woman in general good health who has a long history of mild balance issues who noticed worsening of imbalance and vertigo in May 2025.  Since then, she has done vestibular therapy and feels that she is back to her baseline from before May.  I note that the CT from 2023 showed an age-appropriate brain.  She does have some spinal stenosis in her neck at C4-C5 but does not have any long track signs on examination.  Since hearing is symmetric and she is back to baseline, I do not think we need to check an MRI of the brain/IAC at this time but would reconsider if symptoms worsen again.  She will let us  know if she has worsening symptoms.  Thank you for asking me to see this patient.  Please let me know if I can be of further assistance with her or other  patients in the future.   Shina Wass A. Vear,  MD, [facility] 10/21/2023, 5:53 PM Certified in Neurology, Clinical Neurophysiology, Sleep Medicine and Neuroimaging  [facility] [address] [place], [place] [address] [phone]

## 2023-10-21 ENCOUNTER — Ambulatory Visit: Admitting: Neurology

## 2023-10-21 ENCOUNTER — Encounter: Payer: Self-pay | Admitting: Neurology

## 2023-10-21 VITALS — BP 115/65 | HR 64 | Ht 61.0 in | Wt 147.0 lb

## 2023-10-21 DIAGNOSIS — R42 Dizziness and giddiness: Secondary | ICD-10-CM | POA: Diagnosis not present

## 2023-10-21 DIAGNOSIS — R269 Unspecified abnormalities of gait and mobility: Secondary | ICD-10-CM

## 2023-10-22 DIAGNOSIS — Z9181 History of falling: Secondary | ICD-10-CM | POA: Diagnosis not present

## 2023-10-22 DIAGNOSIS — R2689 Other abnormalities of gait and mobility: Secondary | ICD-10-CM | POA: Diagnosis not present

## 2023-10-28 DIAGNOSIS — Z9181 History of falling: Secondary | ICD-10-CM | POA: Diagnosis not present

## 2023-10-28 DIAGNOSIS — R2689 Other abnormalities of gait and mobility: Secondary | ICD-10-CM | POA: Diagnosis not present

## 2023-11-04 DIAGNOSIS — Z9181 History of falling: Secondary | ICD-10-CM | POA: Diagnosis not present

## 2023-11-04 DIAGNOSIS — R2689 Other abnormalities of gait and mobility: Secondary | ICD-10-CM | POA: Diagnosis not present

## 2023-11-11 DIAGNOSIS — Z9181 History of falling: Secondary | ICD-10-CM | POA: Diagnosis not present

## 2023-11-11 DIAGNOSIS — R2689 Other abnormalities of gait and mobility: Secondary | ICD-10-CM | POA: Diagnosis not present

## 2023-11-20 DIAGNOSIS — Z85828 Personal history of other malignant neoplasm of skin: Secondary | ICD-10-CM | POA: Diagnosis not present

## 2023-11-20 DIAGNOSIS — H61002 Unspecified perichondritis of left external ear: Secondary | ICD-10-CM | POA: Diagnosis not present

## 2023-11-20 DIAGNOSIS — L821 Other seborrheic keratosis: Secondary | ICD-10-CM | POA: Diagnosis not present

## 2023-11-20 DIAGNOSIS — D1801 Hemangioma of skin and subcutaneous tissue: Secondary | ICD-10-CM | POA: Diagnosis not present

## 2023-11-20 DIAGNOSIS — D2261 Melanocytic nevi of right upper limb, including shoulder: Secondary | ICD-10-CM | POA: Diagnosis not present

## 2023-12-01 ENCOUNTER — Encounter: Payer: Self-pay | Admitting: Physician Assistant

## 2023-12-01 ENCOUNTER — Ambulatory Visit: Admitting: Physician Assistant

## 2023-12-01 ENCOUNTER — Other Ambulatory Visit (INDEPENDENT_AMBULATORY_CARE_PROVIDER_SITE_OTHER): Payer: Self-pay

## 2023-12-01 DIAGNOSIS — M25512 Pain in left shoulder: Secondary | ICD-10-CM

## 2023-12-01 DIAGNOSIS — M545 Low back pain, unspecified: Secondary | ICD-10-CM

## 2023-12-01 MED ORDER — METHYLPREDNISOLONE ACETATE 40 MG/ML IJ SUSP
40.0000 mg | INTRAMUSCULAR | Status: AC | PRN
  Administered 2023-12-01: 40 mg via INTRA_ARTICULAR

## 2023-12-01 MED ORDER — LIDOCAINE HCL 1 % IJ SOLN
3.0000 mL | INTRAMUSCULAR | Status: AC | PRN
  Administered 2023-12-01: 3 mL

## 2023-12-01 NOTE — Progress Notes (Signed)
 Office Visit Note   Patient: Sabrina Levy           Date of Birth: 12/22/1945           MRN: [id number] Visit Date: 12/01/2023              Requested by: Onita Rush, MD [address] [place],  [place] [address] PCP: Onita Rush, MD   Assessment & Plan: Visit Diagnoses:  1. Left shoulder pain, unspecified chronicity   2. Acute right-sided low back pain without sciatica     Plan: She is given a prescription for physical therapy at [facility] where she is already doing therapy for balance and gait.  This will include lumbar thoracic range of motion core strengthening, stretching, modalities and home exercise program.  They will also include left shoulder range of motion and strengthening home exercise program and modalities.  Follow-up with us  as needed pain persist or becomes worse.  Questions were encouraged and answered at length  Follow-Up Instructions: Return if symptoms worsen or fail to improve.   Orders:  Orders Placed This Encounter  Procedures   Large Joint Inj   XR Lumbar Spine 2-3 Views   XR Shoulder Left   No orders of the defined types were placed in this encounter.     Procedures: Large Joint Inj on 12/01/2023 11:50 AM Indications: pain Details: 22 G 1.5 in needle, superior approach  Arthrogram: No  Medications: 3 mL lidocaine  1 %; 40 mg methylPREDNISolone  acetate 40 MG/ML Outcome: tolerated well, no immediate complications Procedure, treatment alternatives, risks and benefits explained, specific risks discussed. Consent was given by the patient. Immediately prior to procedure a time out was called to verify the correct patient, procedure, equipment, support staff and site/side marked as required. Patient was prepped and draped in the usual sterile fashion.       Clinical Data: No additional findings.   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 Jacob service comes in today with left shoulder pain has been ongoing for several months.  At least 2-3.  No waking pain.  She states she is is having no numbness tingling down either arm.  Pain is worse with abduction and exterior and all rotation of the left shoulder.  No neck pain.  No injury. Also she is having mid to low back pain.  Describes it as burning and aching pain in the upper lumbar lower thoracic region.  No known injury.  Also right lower lumbar pain with radiation to the buttocks area.  No radicular symptoms down the leg.  No known injury.  No numbness tingling.  States that pain improves with a heating pad for 10 to 13 minutes.  Pain is worse in the morning.  No waking pain. Review of Systems See HPI otherwise negative or noncontributory.  Objective: Vital Signs: There were no vitals taken for this visit.  Physical Exam General: Well-developed well-nourished female no acute distress gets on and off the exam table on her own.  No assistive device. Vascular: Dorsal pedal pulses are 2+ and equal symmetric calf supple nontender. Respirations: Unlabored  Ortho Exam Bilateral shoulders: 5 out of 5 strength external and internal rotation against resistance.  Empty can test is negative bilaterally.  Liftoff test is slightly positive on the left prior to injection after subacromial injection she is 5 out of 5 strengths with lift  off bilaterally.  Impingement testing is negative on the right positive on the left.  Full forward flexion bilateral shoulders. Lower extremities: 5 out of 5 strength throughout the lower extremities against resistance.  Negative straight leg raise bilaterally.  Slightly tight hamstrings bilaterally.  She comes within inch of being up to touch her toes.  Has good extension lumbar spine.  No tenderness over the lumbar lower thoracic spinal column.  Nontender paraspinous region bilaterally.  Good range of motion bilateral hips. Specialty Comments:  No  specialty comments available.  Imaging: XR Lumbar Spine 2-3 Views Result Date: 12/01/2023 Lumbar spine 2 views: No acute fractures.  Degenerative disc disease at T11-T12 with vertebral spurring anteriorly.  Otherwise lumbar spine to space overall well-maintained.  Minimal lower lumbar facet arthritic changes.  Arthrosclerosis aorta.  No acute findings otherwise.  XR Shoulder Left Result Date: 12/01/2023 Left shoulder 3 views: Glenohumeral joint is well-maintained.  No arthritic changes throughout the shoulder girdle.  Shoulder is well located.  No acute fractures acute findings.    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   Diverticulitis of intestine, part unspecified, without perforation or abscess with bleeding 06/28/2020   Eructation 06/28/2020   Imaging of gastrointestinal tract abnormal 06/28/2020   Left lower quadrant pain 06/28/2020   History of colonic polyps 06/28/2020   Essential hypertension 02/25/2019   Shortness of breath 01/14/2019   Chest pain 01/14/2019   Mixed hyperlipidemia 01/14/2019   Headache disorder 10/26/2018   Blood in urine 12/03/2017   Menopausal symptom 12/03/2017   Biliary dyskinesia 05/06/2014   Past Medical History:  Diagnosis Date   Arthritis    osteoarthritis    Cancer ([facility])    hx of basal cell skin cancer - 30 yrs ago per patient   GERD (gastroesophageal reflux disease)    occasional    H/O motion sickness    Pneumonia 2013   aspirate pneumonia due to colonoscopy    Thyroid  nodule    small per patient and md just watching    Urinary tract infection    hx of in 58s     Family History  Problem Relation Age of Onset   Heart attack Father 41   Heart disease Brother 45       Valve replacement    Past Surgical History:  Procedure Laterality Date   ABDOMINAL HYSTERECTOMY     CHOLECYSTECTOMY N/A 05/06/2014   Procedure: LAPAROSCOPIC  CHOLECYSTECTOMY WITH INTRAOPERATIVE CHOLANGIOGRAM;  Surgeon: Krystal Spinner, MD;  Location: [facility];  Service: General;  Laterality: N/A;   COLON RESECTION  2002    NASAL SINUS SURGERY  2002    Social History   Occupational History   Not on file  Tobacco Use   Smoking status: Former   Smokeless tobacco: Never  Vaping Use   Vaping status: Never Used  Substance and Sexual Activity   Alcohol  use: No   Drug use: No   Sexual activity: Not on file

## 2023-12-16 DIAGNOSIS — M545 Low back pain, unspecified: Secondary | ICD-10-CM | POA: Diagnosis not present

## 2023-12-16 DIAGNOSIS — M25512 Pain in left shoulder: Secondary | ICD-10-CM | POA: Diagnosis not present

## 2023-12-16 DIAGNOSIS — R2689 Other abnormalities of gait and mobility: Secondary | ICD-10-CM | POA: Diagnosis not present

## 2023-12-16 DIAGNOSIS — Z9181 History of falling: Secondary | ICD-10-CM | POA: Diagnosis not present

## 2023-12-22 DIAGNOSIS — Z9181 History of falling: Secondary | ICD-10-CM | POA: Diagnosis not present

## 2023-12-22 DIAGNOSIS — M25512 Pain in left shoulder: Secondary | ICD-10-CM | POA: Diagnosis not present

## 2023-12-22 DIAGNOSIS — M545 Low back pain, unspecified: Secondary | ICD-10-CM | POA: Diagnosis not present

## 2023-12-22 DIAGNOSIS — R2689 Other abnormalities of gait and mobility: Secondary | ICD-10-CM | POA: Diagnosis not present

## 2023-12-29 DIAGNOSIS — K219 Gastro-esophageal reflux disease without esophagitis: Secondary | ICD-10-CM | POA: Diagnosis not present

## 2023-12-29 DIAGNOSIS — K5904 Chronic idiopathic constipation: Secondary | ICD-10-CM | POA: Diagnosis not present

## 2024-01-02 DIAGNOSIS — R2689 Other abnormalities of gait and mobility: Secondary | ICD-10-CM | POA: Diagnosis not present

## 2024-01-02 DIAGNOSIS — M25512 Pain in left shoulder: Secondary | ICD-10-CM | POA: Diagnosis not present

## 2024-01-02 DIAGNOSIS — M545 Low back pain, unspecified: Secondary | ICD-10-CM | POA: Diagnosis not present

## 2024-01-02 DIAGNOSIS — Z9181 History of falling: Secondary | ICD-10-CM | POA: Diagnosis not present

## 2024-01-05 ENCOUNTER — Encounter: Payer: Self-pay | Admitting: Podiatry

## 2024-01-05 ENCOUNTER — Ambulatory Visit: Admitting: Podiatry

## 2024-01-05 ENCOUNTER — Ambulatory Visit (INDEPENDENT_AMBULATORY_CARE_PROVIDER_SITE_OTHER)

## 2024-01-05 VITALS — Ht 61.0 in | Wt 147.0 lb

## 2024-01-05 DIAGNOSIS — M7752 Other enthesopathy of left foot: Secondary | ICD-10-CM

## 2024-01-05 DIAGNOSIS — M7751 Other enthesopathy of right foot: Secondary | ICD-10-CM | POA: Diagnosis not present

## 2024-01-05 DIAGNOSIS — G609 Hereditary and idiopathic neuropathy, unspecified: Secondary | ICD-10-CM

## 2024-01-05 NOTE — Progress Notes (Signed)
   Chief Complaint  Patient presents with   Foot Pain    Pt is here due to bilateral foot issues she states for about a month she has had a tingling sensation to the bottom of both feet, states it feels like something is growing at the bottom of the feet.    HPI: 78 y.o. female presenting today for above complaint.  She does admit to purchasing a new pair of shoes about 1 month ago and this is when the onset of her symptoms began  Past Medical History:  Diagnosis Date   Arthritis    osteoarthritis    Cancer ([facility])    hx of basal cell skin cancer - 30 yrs ago per patient   GERD (gastroesophageal reflux disease)    occasional    H/O motion sickness    Pneumonia 2013   aspirate pneumonia due to colonoscopy    Thyroid  nodule    small per patient and md just watching    Urinary tract infection    hx of in 20s     Past Surgical History:  Procedure Laterality Date   ABDOMINAL HYSTERECTOMY     CHOLECYSTECTOMY N/A 05/06/2014   Procedure: LAPAROSCOPIC CHOLECYSTECTOMY WITH INTRAOPERATIVE CHOLANGIOGRAM;  Surgeon: Krystal Spinner, MD;  Location: [facility];  Service: General;  Laterality: N/A;   COLON RESECTION  2002    NASAL SINUS SURGERY  2002     Allergies  Allergen Reactions   Aspirin     Burns stomach   Monosodium Glutamate Other (See Comments)     tears up stomach    Nsaids     Burns stomach   Sulfa Antibiotics Other (See Comments)    Unknown    Sulfacetamide Sodium     Unknown    Sulfamethoxazole     Unsure of reaction   Tolmetin     Burns stomach     Physical Exam: General: The patient is alert and oriented x3 in no acute distress.  Dermatology: Skin is warm, dry and supple bilateral lower extremities.   Vascular: Palpable pedal pulses bilaterally. Capillary refill within normal limits.  No appreciable edema.  No erythema.  Neurological: Grossly intact via light touch.  Paresthesia with light touch noted  Musculoskeletal Exam: No pedal deformities  noted   Assessment/Plan of Care: 1.  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  This may be eliciting her symptoms.  Recommend discontinuing the shoes to see if this resolves her issues -Declined any medication such as gabapentin  -Return to clinic PRN       Thresa EMERSON Sar, DPM [facility]  Dr. Thresa EMERSON Sar, DPM    [address] [place], [place] [address]                Office [phone]  Fax [phone]

## 2024-01-06 ENCOUNTER — Telehealth: Payer: Self-pay | Admitting: Lab

## 2024-01-06 NOTE — Telephone Encounter (Signed)
 Patient has requested to start medication you discussed with her at her appointment. [employer].

## 2024-01-07 DIAGNOSIS — M25512 Pain in left shoulder: Secondary | ICD-10-CM | POA: Diagnosis not present

## 2024-01-07 DIAGNOSIS — M545 Low back pain, unspecified: Secondary | ICD-10-CM | POA: Diagnosis not present

## 2024-01-07 DIAGNOSIS — R2689 Other abnormalities of gait and mobility: Secondary | ICD-10-CM | POA: Diagnosis not present

## 2024-01-07 DIAGNOSIS — Z9181 History of falling: Secondary | ICD-10-CM | POA: Diagnosis not present

## 2024-01-08 ENCOUNTER — Other Ambulatory Visit: Payer: Self-pay | Admitting: Podiatry

## 2024-01-08 MED ORDER — GABAPENTIN 100 MG PO CAPS: 100.0000 mg | ORAL_CAPSULE | Freq: Three times a day (TID) | ORAL | 1 refills | Status: DC

## 2024-01-21 ENCOUNTER — Ambulatory Visit: Admitting: Physician Assistant

## 2024-01-21 DIAGNOSIS — M25512 Pain in left shoulder: Secondary | ICD-10-CM

## 2024-01-21 DIAGNOSIS — M545 Low back pain, unspecified: Secondary | ICD-10-CM

## 2024-01-21 NOTE — Progress Notes (Signed)
 HPI: Sabrina Levy returns today follow-up of her left shoulder and her back pain.  She states that her shoulder is better.  She actually was able to sleep on her left side at night.  Pain does not awaken her.  The only problem that she really has her left shoulder is feeling reaching back.  She feels that the injection gave her no improvement but that therapy at least helped 25%.  No radicular symptoms down the left arm. In regards to her back pain she says therapy really has not helped and maybe has aggravated it.  Pain is only on the right side.  She does note some new numbness and tingling in both feet.  She states she saw podiatrist for this and was told it was coming from her back.  They placed her on gabapentin  she took this once during the day at 100 mg made her sleepy and therefore she is not taking it again.  She is unsure if it really helped with her foot pain but it may have helped with the numbness tingling in the soles.  She feels that therapy has definitely helped with her gait and balance.  ROS: See HPI   Physical exam: General Well-developed well-nourished female ambulates without any assistive device. Bilateral shoulders: 5 out of 5 strength with external and internal rotation against resistance empty can test is negative.  She has full overhead motion of both shoulders.  Positive impingement left shoulder only.  No tenderness about the periscapular area bilaterally. Cervical spine: Good range of motion of cervical spine without pain. Lower extremities: 5 out of 5 strength throughout lower extremities.  Negative straight leg raise.  Tight hamstrings bilaterally. Lumbar spine full extension flexion.  Impression: Left shoulder pain Low back pain  Plan: Offered her MRI of the lumbar spine to rule out HNP given her new numbness tingling soles of feet she defers.  She wants to just lay off some of the back exercises continue to do her stretching and exercises for gait and balance.  In  regards to the shoulder she will continue to work on range of motion and strengthening on her own.  In regards to both the shoulder and the back she states that the discomfort that she has she can live with.  She will follow-up with us  as needed if pain becomes worse in any way.  She can always call and we can obtain an MRI of her lumbar spine especially if she develops any further radicular symptoms.  Questions were encouraged and answered.

## 2024-01-23 DIAGNOSIS — Z9181 History of falling: Secondary | ICD-10-CM | POA: Diagnosis not present

## 2024-01-23 DIAGNOSIS — M545 Low back pain, unspecified: Secondary | ICD-10-CM | POA: Diagnosis not present

## 2024-01-23 DIAGNOSIS — R2689 Other abnormalities of gait and mobility: Secondary | ICD-10-CM | POA: Diagnosis not present

## 2024-01-23 DIAGNOSIS — M25512 Pain in left shoulder: Secondary | ICD-10-CM | POA: Diagnosis not present

## 2024-01-27 DIAGNOSIS — E7849 Other hyperlipidemia: Secondary | ICD-10-CM | POA: Diagnosis not present

## 2024-02-03 DIAGNOSIS — Z1331 Encounter for screening for depression: Secondary | ICD-10-CM | POA: Diagnosis not present

## 2024-02-03 DIAGNOSIS — I700 Atherosclerosis of aorta: Secondary | ICD-10-CM | POA: Diagnosis not present

## 2024-02-03 DIAGNOSIS — E785 Hyperlipidemia, unspecified: Secondary | ICD-10-CM | POA: Diagnosis not present

## 2024-02-03 DIAGNOSIS — Z23 Encounter for immunization: Secondary | ICD-10-CM | POA: Diagnosis not present

## 2024-02-03 DIAGNOSIS — G319 Degenerative disease of nervous system, unspecified: Secondary | ICD-10-CM | POA: Diagnosis not present

## 2024-02-03 DIAGNOSIS — Z0000 Encounter for general adult medical examination without abnormal findings: Secondary | ICD-10-CM | POA: Diagnosis not present

## 2024-02-03 DIAGNOSIS — R2689 Other abnormalities of gait and mobility: Secondary | ICD-10-CM | POA: Diagnosis not present

## 2024-02-03 DIAGNOSIS — R82998 Other abnormal findings in urine: Secondary | ICD-10-CM | POA: Diagnosis not present

## 2024-02-03 DIAGNOSIS — I10 Essential (primary) hypertension: Secondary | ICD-10-CM | POA: Diagnosis not present

## 2024-02-03 DIAGNOSIS — K5900 Constipation, unspecified: Secondary | ICD-10-CM | POA: Diagnosis not present

## 2024-02-03 DIAGNOSIS — K589 Irritable bowel syndrome without diarrhea: Secondary | ICD-10-CM | POA: Diagnosis not present

## 2024-02-03 DIAGNOSIS — Z1389 Encounter for screening for other disorder: Secondary | ICD-10-CM | POA: Diagnosis not present

## 2024-02-03 DIAGNOSIS — E663 Overweight: Secondary | ICD-10-CM | POA: Diagnosis not present

## 2024-02-03 DIAGNOSIS — R030 Elevated blood-pressure reading, without diagnosis of hypertension: Secondary | ICD-10-CM | POA: Diagnosis not present

## 2024-02-03 DIAGNOSIS — E041 Nontoxic single thyroid nodule: Secondary | ICD-10-CM | POA: Diagnosis not present

## 2024-02-03 DIAGNOSIS — R739 Hyperglycemia, unspecified: Secondary | ICD-10-CM | POA: Diagnosis not present

## 2024-02-09 DIAGNOSIS — Z9181 History of falling: Secondary | ICD-10-CM | POA: Diagnosis not present

## 2024-02-09 DIAGNOSIS — M25512 Pain in left shoulder: Secondary | ICD-10-CM | POA: Diagnosis not present

## 2024-02-09 DIAGNOSIS — R2689 Other abnormalities of gait and mobility: Secondary | ICD-10-CM | POA: Diagnosis not present

## 2024-02-09 DIAGNOSIS — M545 Low back pain, unspecified: Secondary | ICD-10-CM | POA: Diagnosis not present

## 2024-03-03 DIAGNOSIS — M545 Low back pain, unspecified: Secondary | ICD-10-CM | POA: Diagnosis not present

## 2024-03-03 DIAGNOSIS — Z9181 History of falling: Secondary | ICD-10-CM | POA: Diagnosis not present

## 2024-03-03 DIAGNOSIS — R2689 Other abnormalities of gait and mobility: Secondary | ICD-10-CM | POA: Diagnosis not present

## 2024-03-03 DIAGNOSIS — M25512 Pain in left shoulder: Secondary | ICD-10-CM | POA: Diagnosis not present

## 2024-03-23 ENCOUNTER — Telehealth: Payer: Self-pay | Admitting: Orthopaedic Surgery

## 2024-03-23 DIAGNOSIS — M545 Low back pain, unspecified: Secondary | ICD-10-CM

## 2024-03-23 NOTE — Telephone Encounter (Signed)
 Pt called and said she is ready to schedule an MRI. Call back number is [phone].

## 2024-03-23 NOTE — Telephone Encounter (Signed)
"  Referral placed   "

## 2024-03-29 ENCOUNTER — Encounter (HOSPITAL_BASED_OUTPATIENT_CLINIC_OR_DEPARTMENT_OTHER): Payer: Self-pay | Admitting: Orthopaedic Surgery

## 2024-04-02 ENCOUNTER — Telehealth: Payer: Self-pay | Admitting: Orthopaedic Surgery

## 2024-04-02 ENCOUNTER — Other Ambulatory Visit (HOSPITAL_BASED_OUTPATIENT_CLINIC_OR_DEPARTMENT_OTHER): Payer: Self-pay | Admitting: Orthopaedic Surgery

## 2024-04-02 NOTE — Telephone Encounter (Signed)
 Rueben from [facility] called stating pt MRI was denied and want to know if Dr Vernetta want a peer to peer. Rueben number is [phone] ext [phone].

## 2024-04-03 ENCOUNTER — Other Ambulatory Visit

## 2024-04-07 ENCOUNTER — Other Ambulatory Visit: Payer: Self-pay

## 2024-04-07 DIAGNOSIS — G8929 Other chronic pain: Secondary | ICD-10-CM

## 2024-04-07 NOTE — Telephone Encounter (Signed)
 Patient states that she has actually already had quite a bit of PT on her back with O Hall[facility]an. Also, states she feels she is doing some better and would like to hold off on the MRI anyway I told her if she starts having a lot of back pain again, maybe we can reorder the MRI since she has already had PT
